# Patient Record
Sex: Female | Born: 1965 | Race: Black or African American | Hispanic: No | Marital: Single
Health system: Southern US, Community
[De-identification: ages and names within clinical notes are randomized; demographics above are authoritative.]

## PROBLEM LIST (undated history)

## (undated) DIAGNOSIS — S12100A Unspecified displaced fracture of second cervical vertebra, initial encounter for closed fracture: Secondary | ICD-10-CM

## (undated) DIAGNOSIS — Z950 Presence of cardiac pacemaker: Secondary | ICD-10-CM

## (undated) DIAGNOSIS — G959 Disease of spinal cord, unspecified: Secondary | ICD-10-CM

## (undated) DIAGNOSIS — M1990 Unspecified osteoarthritis, unspecified site: Secondary | ICD-10-CM

## (undated) DIAGNOSIS — J189 Pneumonia, unspecified organism: Secondary | ICD-10-CM

## (undated) DIAGNOSIS — D509 Iron deficiency anemia, unspecified: Secondary | ICD-10-CM

## (undated) DIAGNOSIS — E119 Type 2 diabetes mellitus without complications: Secondary | ICD-10-CM

## (undated) DIAGNOSIS — G629 Polyneuropathy, unspecified: Secondary | ICD-10-CM

## (undated) DIAGNOSIS — Z8673 Personal history of transient ischemic attack (TIA), and cerebral infarction without residual deficits: Secondary | ICD-10-CM

## (undated) DIAGNOSIS — G5793 Unspecified mononeuropathy of bilateral lower limbs: Secondary | ICD-10-CM

## (undated) DIAGNOSIS — R7989 Other specified abnormal findings of blood chemistry: Secondary | ICD-10-CM

## (undated) DIAGNOSIS — Z8674 Personal history of sudden cardiac arrest: Secondary | ICD-10-CM

## (undated) DIAGNOSIS — G5602 Carpal tunnel syndrome, left upper limb: Secondary | ICD-10-CM

## (undated) DIAGNOSIS — G9519 Other vascular myelopathies: Secondary | ICD-10-CM

## (undated) HISTORY — DX: Polyneuropathy, unspecified: G62.9

## (undated) HISTORY — DX: Type 2 diabetes mellitus without complications: E11.9

## (undated) HISTORY — PX: OTHER SURGICAL HISTORY: SHX169

## (undated) HISTORY — PX: ENDOMETRIAL ABLATION: SHX621

## (undated) HISTORY — DX: Iron deficiency anemia, unspecified: D50.9

## (undated) HISTORY — PX: CERVIX SURGERY: SHX593

## (undated) HISTORY — DX: Disease of spinal cord, unspecified: G95.9

## (undated) HISTORY — DX: Unspecified osteoarthritis, unspecified site: M19.90

---

## 2009-09-17 ENCOUNTER — Ambulatory Visit: Payer: Self-pay | Admitting: Obstetrics & Gynecology

## 2009-09-17 LAB — CONVERTED CEMR LAB
Glucose, Bld: 126 mg/dL — ABNORMAL HIGH (ref 70–99)
MCHC: 27.5 g/dL — ABNORMAL LOW (ref 30.0–36.0)
MCV: 58.1 fL — ABNORMAL LOW (ref 78.0–100.0)
Platelets: 496 10*3/uL — ABNORMAL HIGH (ref 150–400)
RDW: 19.9 % — ABNORMAL HIGH (ref 11.5–15.5)
Total CHOL/HDL Ratio: 2.7
Triglycerides: 67 mg/dL (ref <150)
WBC: 7.3 10*3/uL (ref 4.0–10.5)

## 2009-10-01 ENCOUNTER — Ambulatory Visit (HOSPITAL_COMMUNITY): Admission: RE | Admit: 2009-10-01 | Discharge: 2009-10-01 | Payer: Self-pay | Admitting: Obstetrics & Gynecology

## 2009-10-15 ENCOUNTER — Ambulatory Visit: Payer: Self-pay | Admitting: Obstetrics & Gynecology

## 2009-10-15 LAB — CONVERTED CEMR LAB
Chloride: 108 meq/L (ref 96–112)
Creatinine, Ser: 0.59 mg/dL (ref 0.40–1.20)
Hgb F Quant: 0 % (ref 0.0–2.0)
Hgb S Quant: 33.4 % — ABNORMAL HIGH (ref 0.0–0.0)
RBC Folate: 452 ng/mL (ref 180–600)
Vitamin B-12: 396 pg/mL (ref 211–911)

## 2009-10-21 ENCOUNTER — Ambulatory Visit: Payer: Self-pay | Admitting: Obstetrics & Gynecology

## 2009-11-10 ENCOUNTER — Ambulatory Visit: Payer: Self-pay | Admitting: Obstetrics & Gynecology

## 2010-06-29 NOTE — Assessment & Plan Note (Signed)
NAME:  MALAAK, STACH NO.:  [id number]   MEDICAL RECORD NO.:  [id number]          PATIENT TYPE:  POB   LOCATION:  [facility]         FACILITY:  [facility]   PHYSICIAN:  Allie Bossier, MD        DATE OF BIRTH:  26-Aug-1965   DATE OF SERVICE:  09/17/2009                                  CLINIC NOTE   Ms. Welden is a 45 year old divorced black gravida 1, para 1.  She is a 68-  year-old son.  She comes in here for a new patient annual exam.  She has  not had a Pap smear for at least 10 years.  She has no particular GYN  complaints.   PAST MEDICAL HISTORY:  She has been told she has fibroids, obesity,  history of herpes, osteoarthritis, history of anemia and history of self-  diagnosed depression that has been untreated.   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  She has been abstinent for 2 years.  Previously in a heterosexual  relationship.   PAST SURGICAL HISTORY:  None.   FAMILY HISTORY:  Positive for breast cancer in maternal aunt.  No GYN or  colon cancer.   SOCIAL HISTORY:  Negative for tobacco or drugs.  She drinks alcohol  rarely.   ALLERGIES:  No known drug allergies.  No latex allergies.   PHYSICAL EXAMINATION:  VITAL SIGNS:  Height 5 feet 4 inches, weight 224,  blood pressure 112/76, pulse 75.  HEENT:  Normal.  HEART:  Regular rate and rhythm.  BREASTS:  Normal bilaterally.  ABDOMEN:  Obese, benign.  PELVIC:  External genitalia, No lesions.  Cervix, there is a friable  cervical polyp.  This was partially removed and was unable to remove the  whole thing.  However, the portion I did remove was sent to Pathology.  Bimanual exam reveals an enlarged uterus approximately 8-10 weeks size,  but exam is difficult due to patient's voluntary guarding.  Adnexa are  nontender and there are no appreciable masses.   ASSESSMENT AND PLAN:  1. Annual exam.  I have checked Pap smear and recommended self-breast      and self-vulvar  exams.  I have sent her cervical polyp to the lab      with regard to her uterine fibroids and enlarged uterus.  I am      ordering a GYN ultrasound for health maintenance.  Due to history      of anemia, I am ordering a CBC, a TSH and also checking      her random glucose today.  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  She will follow up in a month.      Allie Bossier, MD     MCD/MEDQ  D:  09/17/2009  T:  09/18/2009  Job:  [id number]

## 2010-06-29 NOTE — Assessment & Plan Note (Signed)
NAME:  Catherine Avery, Catherine Avery NO.:  [id number]   MEDICAL RECORD NO.:  [id number]          PATIENT TYPE:  POB   LOCATION:  [facility]         FACILITY:  [facility]   PHYSICIAN:  Jaynie Collins, MD     DATE OF BIRTH:  04-Sep-1965   DATE OF SERVICE:  10/21/2009                                  CLINIC NOTE   REASON FOR VISIT:  Endometrial biopsy.   HISTORY OF PRESENT ILLNESS:  The patient is a 45 year old gravida 1,  para 1 who was seen for annual examination in September 17, 2009.  At that  visit, the patient was noted to have a friable cervical polyp which was  partly removed and secondary to her history of anemia, uterine fibroids,  an enlarged uterus.  A few studies were ordered including a TIBC, TSH,  and a pelvic ultrasound was also ordered.  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  The patient also had an ultrasound on September 21, 2009,  which was remarkable for a 11-week sized uterus with heterogeneous  myometrium and a trilayer pattern of the endometrium with an anterior-  posterior width of 1.3 cm.  There was also noted to have an area of  focal echogenic thickening, measuring 1.5 x 2 x 1.1.  Appearance did  have flow on Doppler evaluation and suspicious for focal polyp or focal  hyperplasia.  No other abnormalities were seen.  Given this finding, the  patient was scheduled for an endometrial biopsy today.  She does report  that her last period ended a few days ago.  The first day of her last  menstrual period was October 14, 2009, and a urine pregnancy test that  was done today was negative.   ENDOMETRIAL BIOPSY:  The patient was counseled regarding the need for  the endometrial biopsy.  The routine risks were discussed and all her  questions answered.  Written informed consent was obtained.  The patient  was then placed in a  dorsal lithotomy position.  A vaginal speculum was  placed and cervix was swabbed with Betadine x2.  A tenaculum was placed  in the anterior lip of the cervix and then a 3-mm Pipelle was advanced  into the uterine cavity to a depth of 10 cm.  The suction on the Pipelle  was activated and the Pipelle was slowly rotated to obtain some tissue.  The patient did have minimal bleeding at the end of the procedure and  then all instruments were removed from the patient's pelvis.  Routine  post endometrial biopsy instructions were given to the patient.   IMPRESSION:  The patient is a 45 year old gravida 1, para 1 who is  status post endometrial biopsy for a known history of anemia and  menorrhagia.  The patient also has iron deficiency anemia.  She was  given a prescription of ferrous sulfate and Colace, given her iron  deficiency anemia and informed of her hemoglobin AS status.  As for her  abnormal ultrasound findings, we will follow up the  endometrial biopsy  results and the patient was told that when she comes back to discuss the  results in a couple of weeks further management of her menorrhagia will  be discussed at that point.  The patient verbalized understanding of  plan.           ______________________________  Jaynie Collins, MD     UA/MEDQ  D:  10/21/2009  T:  10/21/2009  Job:  [id number]

## 2010-06-29 NOTE — Assessment & Plan Note (Signed)
NAME:  Catherine Avery, NEISES NO.:  [id number]   MEDICAL RECORD NO.:  [id number]          PATIENT TYPE:  POB   LOCATION:  [facility]         FACILITY:  [facility]   PHYSICIAN:  Elsie Lincoln, MD      DATE OF BIRTH:  Jun 25, 1965   DATE OF SERVICE:  10/15/2009                                  CLINIC NOTE   The patient is a 45 year old female who presents for lab results.  Per  patient, just for review, has very heavy periods, but they do only come  once a week or once a month.  She believes she has fibroids.  However,  her transvaginal ultrasound reveals that she does not have fibroids.  It  does reveal that she possibly has a focal polyp or thick endometrium at  the fundus.  I believe the endometrial biopsy is warranted and that she  will be rescheduled for that next week as she does not like it today.  Other results, she is anemic at a hemoglobin of 7.0, it is microcytic.  Her platelets are elevated at 796, but this can be seen with anemia.  We  are going to need a workup, but most likely it will be iron deficiency  and we will start her on iron when those labs return.  She does not know  if she is sickle cell trait, but she does not believe she has.  Her  cholesterol is completely normal which is good news.  Her TSH is normal.  Her glucose is 126, but she was not fasting, although it was easy to  believe that she was.  She had eaten an oatmeal from [employer], so we are  drawing another glucose today.  Her Pap smear was normal.  We have done  an endocervical polyp removal which came back with no malignant features  and her mammogram was BI-RADS I.  We had a lengthy discussion about all  these results.  She understands she needs an endometrial biopsy.  This  has been scheduled next week.  I believe that eventually she will need D  and C, hysteroscopy, and hydrothermal ablation as I believe this is the  best method to get a polyp other than a ThermaChoice balloon or  NovaSure.  We are also getting records from [place] because she is  under the impression that she did have fibroids, although our ultrasound  did not show fibroids.  She would just like [place] to review her old records  and say why is that she was told she had fibroids.  The patient to come  back in a week           ______________________________  Elsie Lincoln, MD     KL/MEDQ  D:  10/15/2009  T:  10/16/2009  Job:  [id number]

## 2011-01-21 ENCOUNTER — Inpatient Hospital Stay: Payer: Self-pay | Admitting: Internal Medicine

## 2011-02-21 ENCOUNTER — Ambulatory Visit: Payer: Self-pay | Admitting: Internal Medicine

## 2011-02-21 LAB — CBC CANCER CENTER
Basophil %: 0 %
Eosinophil %: 2.6 %
HCT: 31.5 % — ABNORMAL LOW (ref 35.0–47.0)
Lymphocyte #: 2 x10 3/mm (ref 1.0–3.6)
MCH: 22.8 pg — ABNORMAL LOW (ref 26.0–34.0)
MCHC: 32.6 g/dL (ref 32.0–36.0)
MCV: 70 fL — ABNORMAL LOW (ref 80–100)
Monocyte #: 0.6 x10 3/mm (ref 0.0–0.7)

## 2011-02-21 LAB — IRON AND TIBC
Iron Bind.Cap.(Total): 322 ug/dL (ref 250–450)
Unbound Iron-Bind.Cap.: 293 ug/dL

## 2011-02-21 LAB — FERRITIN: Ferritin (ARMC): 20 ng/mL (ref 8–388)

## 2011-03-18 ENCOUNTER — Ambulatory Visit: Payer: Self-pay | Admitting: Internal Medicine

## 2011-03-21 LAB — CBC CANCER CENTER
Basophil #: 0 x10 3/mm (ref 0.0–0.1)
Basophil %: 0.2 %
HGB: 10.4 g/dL — ABNORMAL LOW (ref 12.0–16.0)
Lymphocyte %: 18 %
Monocyte %: 9.3 %
Neutrophil #: 7.2 x10 3/mm — ABNORMAL HIGH (ref 1.4–6.5)
RBC: 4.31 10*6/uL (ref 3.80–5.20)

## 2011-04-11 LAB — IRON AND TIBC
Iron Bind.Cap.(Total): 309 ug/dL (ref 250–450)
Iron: 52 ug/dL (ref 50–170)
Unbound Iron-Bind.Cap.: 257 ug/dL

## 2011-04-11 LAB — CANCER CENTER HEMOGLOBIN: HGB: 11.7 g/dL — ABNORMAL LOW (ref 12.0–16.0)

## 2011-04-15 ENCOUNTER — Ambulatory Visit: Payer: Self-pay | Admitting: Internal Medicine

## 2011-05-16 ENCOUNTER — Ambulatory Visit: Payer: Self-pay | Admitting: Internal Medicine

## 2011-05-26 LAB — CANCER CENTER HEMOGLOBIN: HGB: 11.2 g/dL — ABNORMAL LOW (ref 12.0–16.0)

## 2011-06-15 ENCOUNTER — Ambulatory Visit: Payer: Self-pay | Admitting: Internal Medicine

## 2011-07-05 ENCOUNTER — Ambulatory Visit: Payer: Self-pay | Admitting: Internal Medicine

## 2011-07-16 ENCOUNTER — Ambulatory Visit: Payer: Self-pay | Admitting: Internal Medicine

## 2011-07-27 LAB — CANCER CENTER HEMOGLOBIN: HGB: 11.8 g/dL — ABNORMAL LOW (ref 12.0–16.0)

## 2011-07-27 LAB — IRON AND TIBC: Unbound Iron-Bind.Cap.: 254 ug/dL

## 2011-08-15 ENCOUNTER — Ambulatory Visit: Payer: Self-pay | Admitting: Internal Medicine

## 2011-08-24 LAB — CBC CANCER CENTER
HCT: 33.3 % — ABNORMAL LOW (ref 35.0–47.0)
Lymphocyte #: 1.8 x10 3/mm (ref 1.0–3.6)
Lymphocyte %: 24.6 %
MCHC: 32.2 g/dL (ref 32.0–36.0)
MCV: 81 fL (ref 80–100)
Monocyte #: 0.6 x10 3/mm (ref 0.2–0.9)
Neutrophil #: 4.7 x10 3/mm (ref 1.4–6.5)
RDW: 17.3 % — ABNORMAL HIGH (ref 11.5–14.5)

## 2011-09-15 ENCOUNTER — Ambulatory Visit: Payer: Self-pay | Admitting: Internal Medicine

## 2011-10-19 ENCOUNTER — Ambulatory Visit: Payer: Self-pay | Admitting: Internal Medicine

## 2011-10-19 LAB — CANCER CENTER HEMOGLOBIN: HGB: 7.8 g/dL — ABNORMAL LOW (ref 12.0–16.0)

## 2011-10-19 LAB — IRON AND TIBC: Iron Bind.Cap.(Total): 256 ug/dL (ref 250–450)

## 2011-10-20 LAB — PREGNANCY, URINE: Pregnancy Test, Urine: NEGATIVE m[IU]/mL

## 2011-11-15 ENCOUNTER — Ambulatory Visit: Payer: Self-pay | Admitting: Internal Medicine

## 2011-11-30 LAB — IRON AND TIBC
Iron Saturation: 11 %
Iron: 33 ug/dL — ABNORMAL LOW (ref 50–170)
Unbound Iron-Bind.Cap.: 272 ug/dL

## 2011-12-16 ENCOUNTER — Ambulatory Visit: Payer: Self-pay | Admitting: Internal Medicine

## 2011-12-28 LAB — CANCER CENTER HEMOGLOBIN: HGB: 11 g/dL — ABNORMAL LOW (ref 12.0–16.0)

## 2011-12-28 LAB — FERRITIN: Ferritin (ARMC): 51 ng/mL (ref 8–388)

## 2012-01-15 ENCOUNTER — Ambulatory Visit: Payer: Self-pay | Admitting: Internal Medicine

## 2012-01-18 LAB — CBC CANCER CENTER
Basophil #: 0.1 x10 3/mm (ref 0.0–0.1)
Eosinophil #: 0.3 x10 3/mm (ref 0.0–0.7)
HCT: 35.6 % (ref 35.0–47.0)
Lymphocyte #: 1.6 x10 3/mm (ref 1.0–3.6)
MCH: 25.2 pg — ABNORMAL LOW (ref 26.0–34.0)
MCHC: 32.9 g/dL (ref 32.0–36.0)
MCV: 77 fL — ABNORMAL LOW (ref 80–100)
Monocyte #: 0.5 x10 3/mm (ref 0.2–0.9)
Monocyte %: 7.2 %
Neutrophil #: 4 x10 3/mm (ref 1.4–6.5)
Platelet: 301 x10 3/mm (ref 150–440)
RDW: 14.7 % — ABNORMAL HIGH (ref 11.5–14.5)
WBC: 6.5 x10 3/mm (ref 3.6–11.0)

## 2012-02-06 LAB — FERRITIN: Ferritin (ARMC): 36 ng/mL (ref 8–388)

## 2012-02-15 ENCOUNTER — Ambulatory Visit: Payer: Self-pay | Admitting: Internal Medicine

## 2012-02-27 LAB — CANCER CENTER HEMOGLOBIN: HGB: 12.3 g/dL (ref 12.0–16.0)

## 2012-03-17 ENCOUNTER — Ambulatory Visit: Payer: Self-pay | Admitting: Internal Medicine

## 2012-04-09 LAB — CANCER CENTER HEMOGLOBIN: HGB: 13 g/dL (ref 12.0–16.0)

## 2012-04-09 LAB — FERRITIN: Ferritin (ARMC): 52 ng/mL (ref 8–388)

## 2012-04-14 ENCOUNTER — Ambulatory Visit: Payer: Self-pay | Admitting: Internal Medicine

## 2012-05-18 ENCOUNTER — Ambulatory Visit: Payer: Self-pay | Admitting: Internal Medicine

## 2012-05-21 LAB — CANCER CENTER HEMOGLOBIN: HGB: 13.5 g/dL (ref 12.0–16.0)

## 2012-06-14 ENCOUNTER — Ambulatory Visit: Payer: Self-pay | Admitting: Internal Medicine

## 2012-10-10 ENCOUNTER — Ambulatory Visit: Payer: Self-pay | Admitting: Internal Medicine

## 2013-01-09 IMAGING — MG MM CAD SCREENING MAMMO
1 series · 7 of 7 positions shown · non-contrast
Comparison: none

REASON FOR EXAM: SCR MAMMO NO ORDER
COMMENTS:

[R CC · right · 7 of 7 slices shown]
[im 1/7]
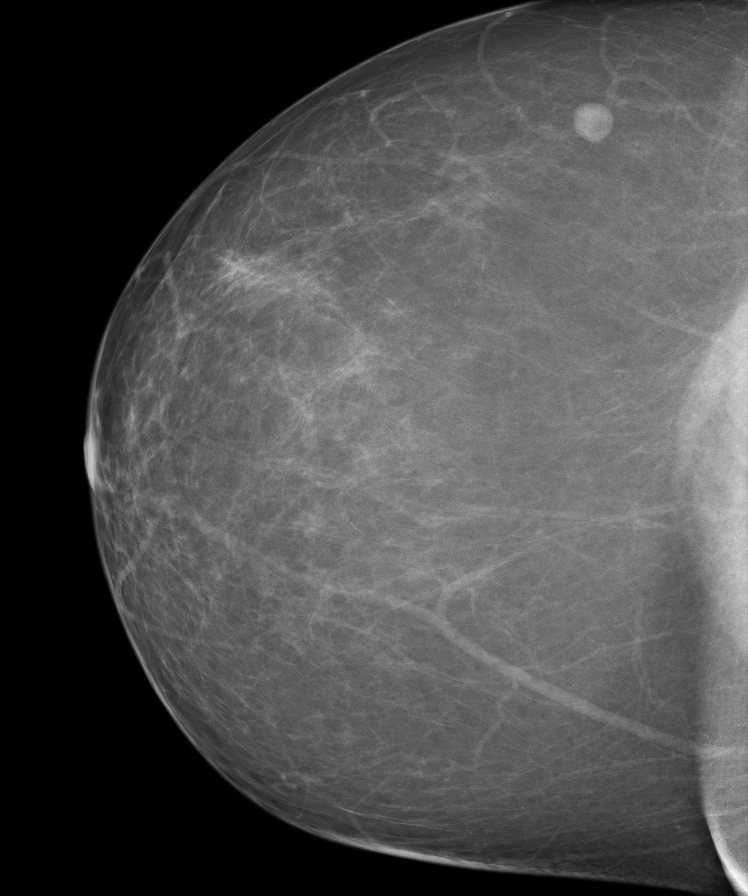
[im 2/7]
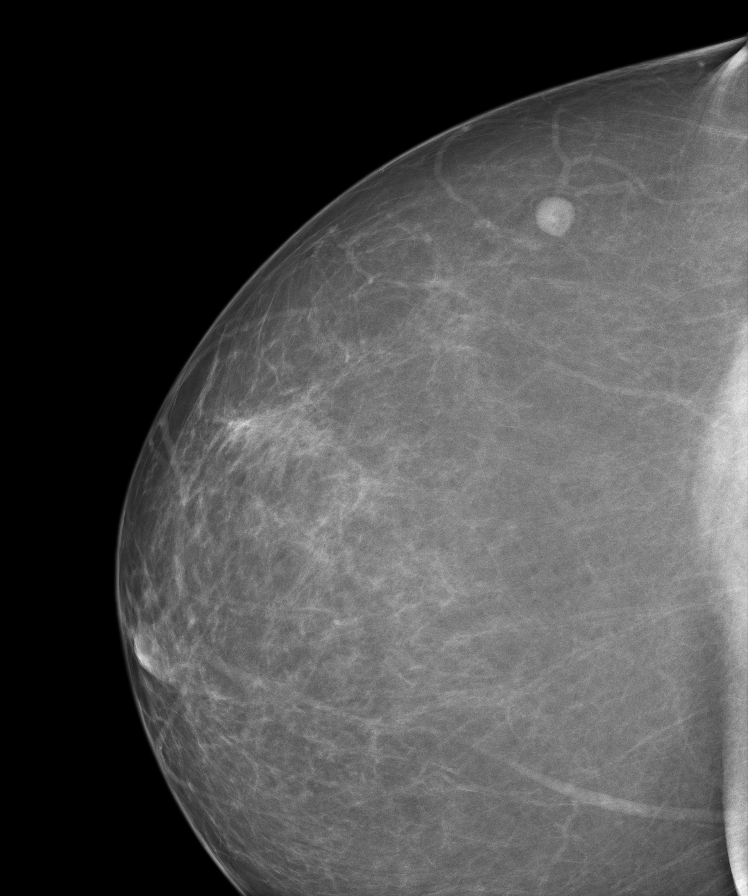
[im 3/7]
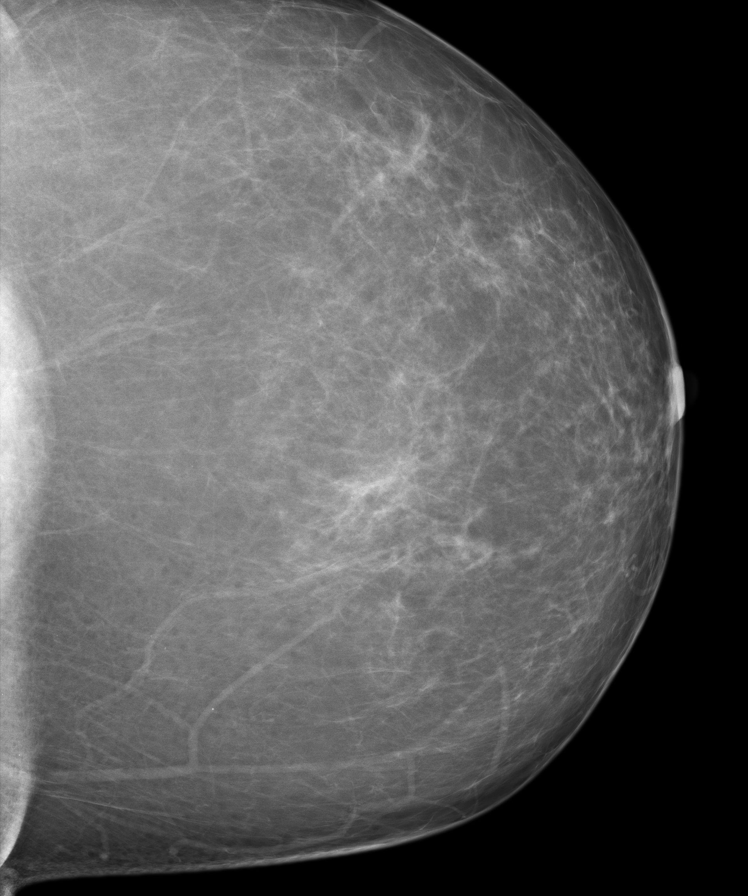
[im 4/7]
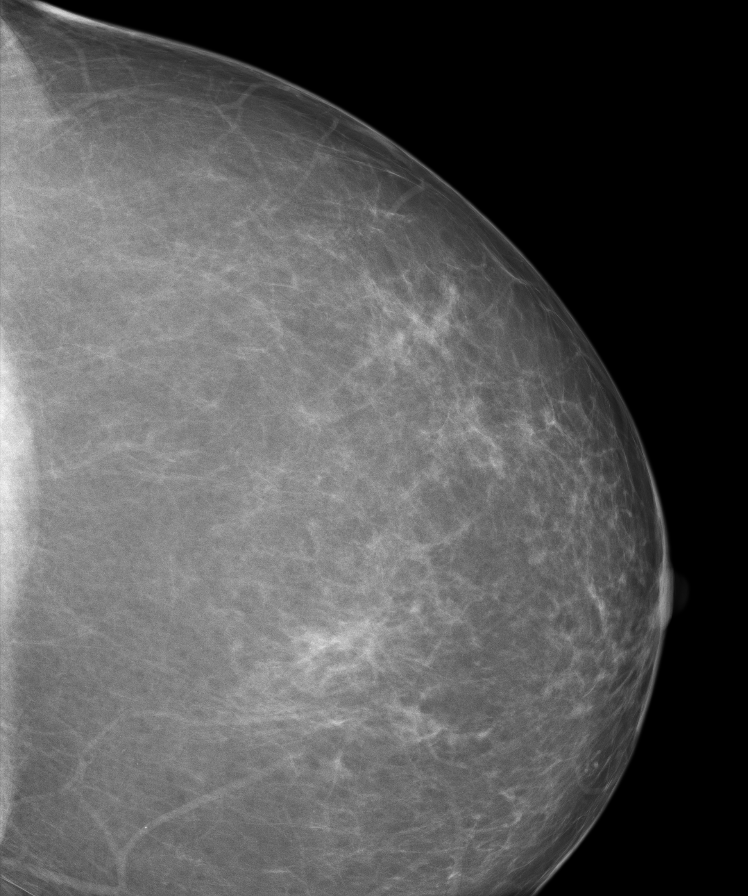
[im 5/7]
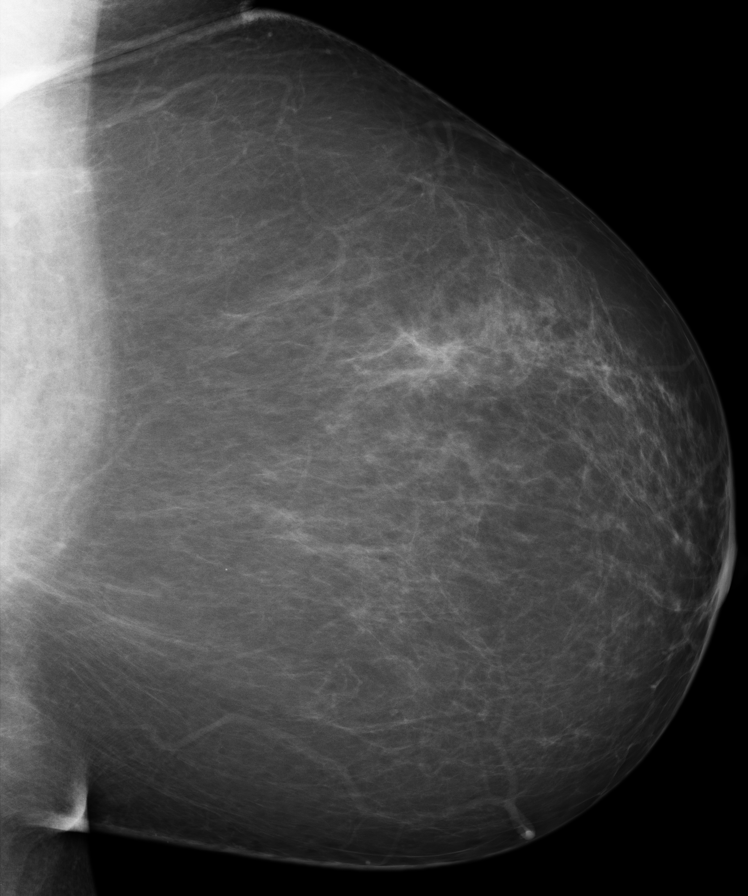
[im 6/7]
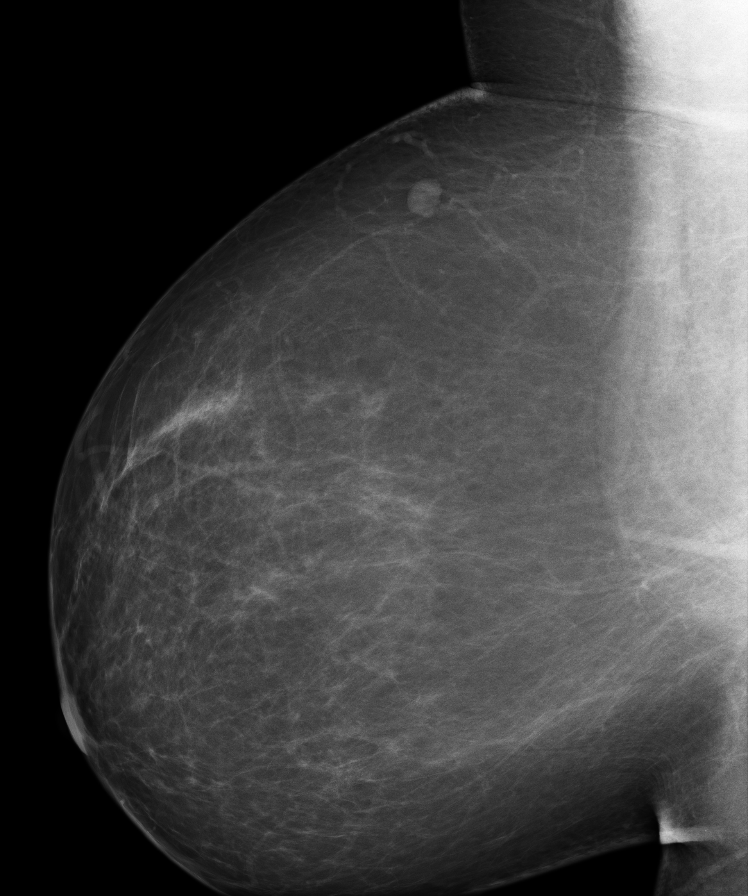
[im 7/7]
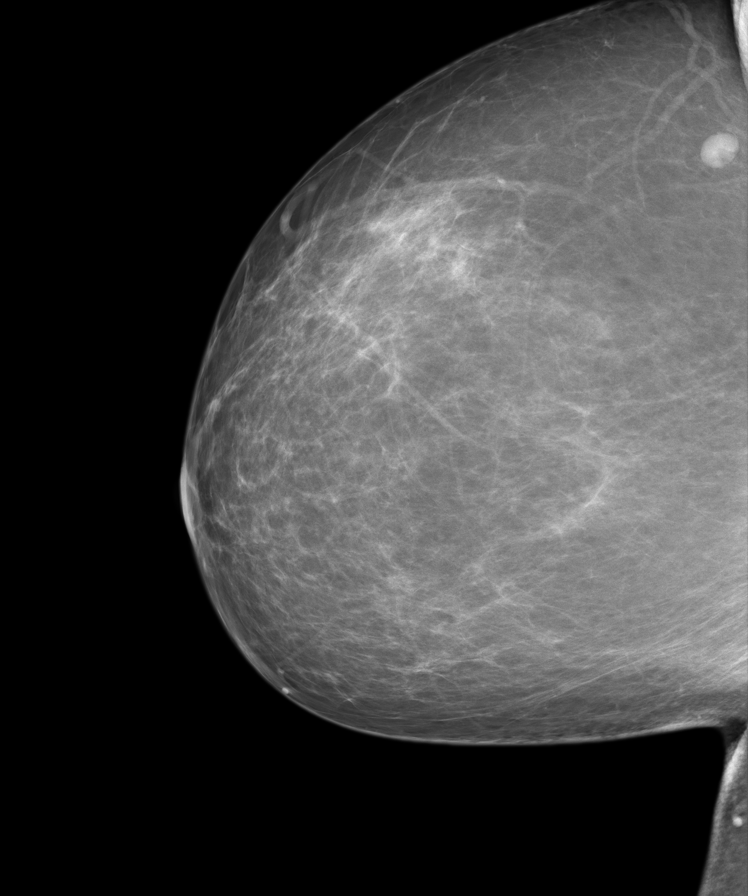

[7 of 7 positions shown; findings below may reference images not displayed]

PROCEDURE:     MAM - MAM DGTL SCRN MAM NO ORDER W/CAD  - July 05, 2011 [DATE]

RESULT:     Comparison is made to prior study dated 10-01-2009.

The breasts demonstrate a mild heterogeneous parenchymal pattern. Stable,
smoothly marginated nodular density projects in the superolateral portion of
the right breast. This demonstrates a benign appearance. There is no
radiographic evidence to suggest malignancy.
IMPRESSION: BI-RADS: Category 2 - Benign Findings

A NEGATIVE MAMMOGRAM REPORT DOES NOT PRECLUDE BIOPSY OR OTHER EVALUATION OF
A CLINICALLY PALPABLE OR OTHERWISE SUSPICIOUS MASS OR LESION. BREAST CANCER
MAY NOT BE DETECTED BY MAMMOGRAPHY IN UP TO 10% OF CASES.

## 2013-03-25 ENCOUNTER — Ambulatory Visit: Payer: Self-pay | Admitting: Neurology

## 2013-12-06 ENCOUNTER — Ambulatory Visit: Payer: Self-pay | Admitting: Internal Medicine

## 2014-05-01 ENCOUNTER — Ambulatory Visit: Payer: Self-pay | Admitting: Internal Medicine

## 2015-02-04 ENCOUNTER — Ambulatory Visit
Admission: RE | Admit: 2015-02-04 | Discharge: 2015-02-04 | Disposition: A | Discharge status: Home / Self Care | Payer: Disability Insurance | Source: Ambulatory Visit | Attending: Dentistry | Admitting: Dentistry

## 2015-02-04 ENCOUNTER — Other Ambulatory Visit: Payer: Self-pay | Admitting: Dentistry

## 2015-02-04 DIAGNOSIS — Z981 Arthrodesis status: Secondary | ICD-10-CM | POA: Diagnosis not present

## 2015-02-04 DIAGNOSIS — M5090 Cervical disc disorder, unspecified, unspecified cervical region: Secondary | ICD-10-CM | POA: Insufficient documentation

## 2015-02-04 DIAGNOSIS — M47814 Spondylosis without myelopathy or radiculopathy, thoracic region: Secondary | ICD-10-CM | POA: Diagnosis not present

## 2015-06-23 ENCOUNTER — Encounter: Payer: Self-pay | Admitting: Emergency Medicine

## 2015-06-23 ENCOUNTER — Emergency Department
Admission: EM | Admit: 2015-06-23 | Discharge: 2015-06-24 | Disposition: A | Discharge status: Home / Self Care | Payer: BLUE CROSS/BLUE SHIELD | Attending: Emergency Medicine | Admitting: Emergency Medicine

## 2015-06-23 DIAGNOSIS — Y939 Activity, unspecified: Secondary | ICD-10-CM | POA: Insufficient documentation

## 2015-06-23 DIAGNOSIS — Y929 Unspecified place or not applicable: Secondary | ICD-10-CM | POA: Insufficient documentation

## 2015-06-23 DIAGNOSIS — W57XXXA Bitten or stung by nonvenomous insect and other nonvenomous arthropods, initial encounter: Secondary | ICD-10-CM | POA: Insufficient documentation

## 2015-06-23 DIAGNOSIS — S30860A Insect bite (nonvenomous) of lower back and pelvis, initial encounter: Secondary | ICD-10-CM | POA: Diagnosis present

## 2015-06-23 DIAGNOSIS — Y999 Unspecified external cause status: Secondary | ICD-10-CM | POA: Insufficient documentation

## 2015-06-23 NOTE — ED Notes (Signed)
Patient ambulatory to triage with steady gait, without difficulty or distress noted; pt reports embedded tick into lower back

## 2015-06-24 MED ORDER — DOXYCYCLINE HYCLATE 100 MG PO TABS: 100.0000 mg | ORAL_TABLET | Freq: Two times a day (BID) | ORAL | Status: DC

## 2015-06-24 MED ORDER — DOXYCYCLINE HYCLATE 100 MG PO TABS
100.0000 mg | ORAL_TABLET | Freq: Once | ORAL | Status: AC
  Administered 2015-06-24: 100 mg via ORAL
  Filled 2015-06-24: qty 1

## 2015-06-24 NOTE — ED Provider Notes (Signed)
[facility] Emergency Department Provider Note  ____________________________________________  Time seen: 12:10 AM  I have reviewed the triage vital signs and the nursing notes.   HISTORY  Chief Complaint Insect Bite      HPI Catherine Avery is a 50 y.o. female presents with "tick embedded in her lower back". Patient states that she's had chills and generalized muscle aches 4 days. Patient states that she's had difficulty keeping warm and has been sitting in front of a heater as a result. Patient denies any rash no fever. Afebrile on presentation with a temperature 98.8    Past medical history None There are no active problems to display for this patient.   Past Surgical History  Procedure Laterality Date  . Cervix surgery      No current outpatient prescriptions on file.  Allergies No known drug allergies No family history on file.  Social History Social History  Substance Use Topics  . Smoking status: None  . Smokeless tobacco: None  . Alcohol Use: None    Review of Systems  Constitutional: Negative for fever. Eyes: Negative for visual changes. ENT: Negative for sore throat. Cardiovascular: Negative for chest pain. Respiratory: Negative for shortness of breath. Gastrointestinal: Negative for abdominal pain, vomiting and diarrhea. Genitourinary: Negative for dysuria. Musculoskeletal: Negative for back pain. Skin: Negative for rash.Positive for tick bite Neurological: Negative for headaches, focal weakness or numbness.   10-point ROS otherwise negative.  ____________________________________________   PHYSICAL EXAM:  VITAL SIGNS: ED Triage Vitals  Enc Vitals Group     BP 06/23/15 2312 103/63 mmHg     Pulse Rate 06/23/15 2312 61     Resp 06/23/15 2312 18     Temp 06/23/15 2312 98.8 F (37.1 C)     Temp Source 06/23/15 2312 Oral     SpO2 06/23/15 2312 100 %     Weight --      Height --      Head Cir --      Peak Flow  --      Pain Score 06/24/15 0013 4     Pain Loc --      Pain Edu? --      Excl. in GC? --      Constitutional: Alert and oriented. Well appearing and in no distress. Eyes: Conjunctivae are normal. PERRL. Normal extraocular movements. ENT   Head: Normocephalic and atraumatic.   Nose: No congestion/rhinnorhea.   Mouth/Throat: Mucous membranes are moist.   Neck: No stridor. Musculoskeletal: Nontender with normal range of motion in all extremities. No joint effusions.  No lower extremity tenderness nor edema. Neurologic:  Normal speech and language. No gross focal neurologic deficits are appreciated. Speech is normal.  Skin:  Skin is warm, dry and intact. No rash noted. Psychiatric: Mood and affect are normal. Speech and behavior are normal. Patient exhibits appropriate insight and judgment.     INITIAL IMPRESSION / ASSESSMENT AND PLAN / ED COURSE  Pertinent labs & imaging results that were available during my care of the patient were reviewed by me and considered in my medical decision making (see chart for details).  Embedded tick removed from the patient's left lower back via tweezers  ____________________________________________   FINAL CLINICAL IMPRESSION(S) / ED DIAGNOSES  Final diagnoses:  Tick bite of back, initial encounter      Darci Currentandolph N Brown, MD 06/24/15 [id number]

## 2015-06-24 NOTE — Discharge Instructions (Signed)
Tick Bite Information Ticks are insects that attach themselves to the skin and draw blood for food. There are various types of ticks. Common types include wood ticks and deer ticks. Most ticks live in shrubs and grassy areas. Ticks can climb onto your body when you make contact with leaves or grass where the tick is waiting. The most common places on the body for ticks to attach themselves are the scalp, neck, armpits, waist, and groin. Most tick bites are harmless, but sometimes ticks carry germs that cause diseases. These germs can be spread to a person during the tick's feeding process. The chance of a disease spreading through a tick bite depends on:   The type of tick.  Time of year.   How long the tick is attached.   Geographic location.  HOW CAN YOU PREVENT TICK BITES? Take these steps to help prevent tick bites when you are outdoors:  Wear protective clothing. Long sleeves and long pants are best.   Wear white clothes so you can see ticks more easily.  Tuck your pant legs into your socks.   If walking on a trail, stay in the middle of the trail to avoid brushing against bushes.  Avoid walking through areas with long grass.  Put insect repellent on all exposed skin and along boot tops, pant legs, and sleeve cuffs.   Check clothing, hair, and skin repeatedly and before going inside.   Brush off any ticks that are not attached.  Take a shower or bath as soon as possible after being outdoors.  WHAT IS THE PROPER WAY TO REMOVE A TICK? Ticks should be removed as soon as possible to help prevent diseases caused by tick bites. 1. If latex gloves are available, put them on before trying to remove a tick.  2. Using fine-point tweezers, grasp the tick as close to the skin as possible. You may also use curved forceps or a tick removal tool. Grasp the tick as close to its head as possible. Avoid grasping the tick on its body. 3. Pull gently with steady upward pressure until  the tick lets go. Do not twist the tick or jerk it suddenly. This may break off the tick's head or mouth parts. 4. Do not squeeze or crush the tick's body. This could force disease-carrying fluids from the tick into your body.  5. After the tick is removed, wash the bite area and your hands with soap and water or other disinfectant such as alcohol. 6. Apply a small amount of antiseptic cream or ointment to the bite site.  7. Wash and disinfect any instruments that were used.  Do not try to remove a tick by applying a hot match, petroleum jelly, or fingernail polish to the tick. These methods do not work and may increase the chances of disease being spread from the tick bite.  WHEN SHOULD YOU SEEK MEDICAL CARE? Contact your health care provider if you are unable to remove a tick from your skin or if a part of the tick breaks off and is stuck in the skin.  After a tick bite, you need to be aware of signs and symptoms that could be related to diseases spread by ticks. Contact your health care provider if you develop any of the following in the days or weeks after the tick bite:  Unexplained fever.  Rash. A circular rash that appears days or weeks after the tick bite may indicate the possibility of Lyme disease. The rash may resemble   a target with a bull's-eye and may occur at a different part of your body than the tick bite.  Redness and swelling in the area of the tick bite.   Tender, swollen lymph glands.   Diarrhea.   Weight loss.   Cough.   Fatigue.   Muscle, joint, or bone pain.   Abdominal pain.   Headache.   Lethargy or a change in your level of consciousness.  Difficulty walking or moving your legs.   Numbness in the legs.   Paralysis.  Shortness of breath.   Confusion.   Repeated vomiting.    This information is not intended to replace advice given to you by your health care provider. Make sure you discuss any questions you have with your health  care provider.   Document Released: 01/29/2000 Document Revised: 02/21/2014 Document Reviewed: 07/11/2012 Elsevier Interactive Patient Education 2016 [employer]  

## 2015-10-30 ENCOUNTER — Other Ambulatory Visit: Payer: Self-pay | Admitting: Internal Medicine

## 2015-10-30 DIAGNOSIS — Z1231 Encounter for screening mammogram for malignant neoplasm of breast: Secondary | ICD-10-CM

## 2015-11-23 ENCOUNTER — Ambulatory Visit
Admission: RE | Admit: 2015-11-23 | Discharge: 2015-11-23 | Disposition: A | Discharge status: Home / Self Care | Payer: BLUE CROSS/BLUE SHIELD | Source: Ambulatory Visit | Attending: Internal Medicine | Admitting: Internal Medicine

## 2015-11-23 DIAGNOSIS — Z1231 Encounter for screening mammogram for malignant neoplasm of breast: Secondary | ICD-10-CM | POA: Insufficient documentation

## 2016-02-10 ENCOUNTER — Other Ambulatory Visit: Payer: Self-pay | Admitting: Internal Medicine

## 2016-02-10 ENCOUNTER — Ambulatory Visit
Admission: RE | Admit: 2016-02-10 | Discharge: 2016-02-10 | Disposition: A | Discharge status: Home / Self Care | Payer: BLUE CROSS/BLUE SHIELD | Source: Ambulatory Visit | Attending: Internal Medicine | Admitting: Internal Medicine

## 2016-02-10 DIAGNOSIS — L950 Livedoid vasculitis: Secondary | ICD-10-CM

## 2016-02-10 DIAGNOSIS — R231 Pallor: Principal | ICD-10-CM

## 2016-02-10 DIAGNOSIS — M7989 Other specified soft tissue disorders: Secondary | ICD-10-CM | POA: Diagnosis present

## 2016-11-15 ENCOUNTER — Other Ambulatory Visit: Payer: Self-pay | Admitting: Internal Medicine

## 2016-11-15 DIAGNOSIS — Z1231 Encounter for screening mammogram for malignant neoplasm of breast: Secondary | ICD-10-CM

## 2016-12-14 ENCOUNTER — Ambulatory Visit
Admission: RE | Admit: 2016-12-14 | Discharge: 2016-12-14 | Disposition: A | Discharge status: Home / Self Care | Payer: BLUE CROSS/BLUE SHIELD | Source: Ambulatory Visit | Attending: Internal Medicine | Admitting: Internal Medicine

## 2016-12-14 DIAGNOSIS — Z1231 Encounter for screening mammogram for malignant neoplasm of breast: Secondary | ICD-10-CM | POA: Insufficient documentation

## 2017-06-23 ENCOUNTER — Other Ambulatory Visit: Payer: Self-pay | Admitting: Nurse Practitioner

## 2017-06-23 DIAGNOSIS — I639 Cerebral infarction, unspecified: Secondary | ICD-10-CM

## 2017-07-01 ENCOUNTER — Ambulatory Visit
Admission: RE | Admit: 2017-07-01 | Discharge: 2017-07-01 | Disposition: A | Discharge status: Home / Self Care | Payer: Medicare Other | Source: Ambulatory Visit | Attending: Nurse Practitioner | Admitting: Nurse Practitioner

## 2017-07-01 DIAGNOSIS — I639 Cerebral infarction, unspecified: Secondary | ICD-10-CM | POA: Diagnosis present

## 2018-03-09 ENCOUNTER — Other Ambulatory Visit: Payer: Self-pay | Admitting: Internal Medicine

## 2018-03-09 DIAGNOSIS — Z1231 Encounter for screening mammogram for malignant neoplasm of breast: Secondary | ICD-10-CM

## 2018-03-30 ENCOUNTER — Ambulatory Visit
Admission: RE | Admit: 2018-03-30 | Discharge: 2018-03-30 | Disposition: A | Discharge status: Home / Self Care | Payer: Medicare Other | Source: Ambulatory Visit | Attending: Internal Medicine | Admitting: Internal Medicine

## 2018-03-30 DIAGNOSIS — Z1231 Encounter for screening mammogram for malignant neoplasm of breast: Secondary | ICD-10-CM | POA: Diagnosis present

## 2018-04-03 ENCOUNTER — Ambulatory Visit: Payer: Medicare Other | Attending: Neurology | Admitting: Physical Therapy

## 2018-04-03 DIAGNOSIS — M2560 Stiffness of unspecified joint, not elsewhere classified: Secondary | ICD-10-CM | POA: Diagnosis present

## 2018-04-03 DIAGNOSIS — G459 Transient cerebral ischemic attack, unspecified: Secondary | ICD-10-CM | POA: Insufficient documentation

## 2018-04-03 DIAGNOSIS — Z9181 History of falling: Secondary | ICD-10-CM | POA: Insufficient documentation

## 2018-04-03 DIAGNOSIS — R269 Unspecified abnormalities of gait and mobility: Secondary | ICD-10-CM | POA: Diagnosis present

## 2018-04-03 DIAGNOSIS — M6281 Muscle weakness (generalized): Secondary | ICD-10-CM | POA: Diagnosis present

## 2018-04-03 NOTE — Patient Instructions (Signed)
Access Code: [email or web address]  URL: [email or web address]  Date: 04/03/2018  Prepared by: Dorene Grebe   Exercises  Seated Shoulder Flexion Full Range Single Arm - 10 reps - 2 sets - 1x daily - 7x weekly  Seated Scapular Retraction - 10 reps - 2 sets - 1x daily - 7x weekly  Seated Long Arc Quad - 10 reps - 2 sets - 1x daily - 7x weekly

## 2018-04-04 ENCOUNTER — Encounter: Payer: Self-pay | Admitting: Physical Therapy

## 2018-04-04 NOTE — Therapy (Signed)
[facility] [address] [place], [place], [address] Phone: [phone]   Fax:  [phone]  Physical Therapy Treatment  Patient Details  Name: Catherine Avery MRN: [id number] Date of Birth: 01-12-1966 Referring Provider (PT): Dr. Cristopher Peru   Encounter Date: 04/03/2018  PT End of Session - 04/04/18 1031    Visit Number  1    Number of Visits  9    Date for PT Re-Evaluation  05/01/18    PT Start Time  1602    PT Stop Time  1719    PT Time Calculation (min)  77 min    Equipment Utilized During Treatment  Gait belt    Activity Tolerance  Patient tolerated treatment well;Patient limited by fatigue    Behavior During Therapy  [facility] for tasks assessed/performed       History reviewed. No pertinent past medical history.  Past Surgical History:  Procedure Laterality Date  . CERVIX SURGERY      There were no vitals filed for this visit.  Subjective Assessment - 04/04/18 0853    Subjective  Pt. states since her TIA in April 2019 her left UE and LE have been weak and "heavy." Pt. reports occasional episodes since then in which she "feels like something is coming on and my face will burn and tighten up and something will move down my left arm." Pt. reports that she "relaxes and breathes" in order for this feeling to pass. Pt. states she moves slowly and cautiously in order to prevent falls and "listens to her body to when it feels weak." Pt. reports her L arm is constantly "lit up 24/7" and feels sharp and tingly. Pt. also reports that her L LE feels "heavy and like weights are pulling it down." Pt. states she has difficulties with gripping items and reports sensation changes in L hand. Pt. ambulates with SPC but states in her home she walks around using furniture for balance. Pt. has difficulty falling alseep 2/2 L arm discomfort and neck/shld pain but once she falls asleep, she can stay sleeping. Pt. also states she is cold all of the time and  uses electric blanket. Pt. also reports B knee pain and L sided LBP especially when getting in/ out of the car. Pt reports exercising at [employer] and is "very cautious when getting on and off the treadmill."    Pertinent History  Pt. is a pleasant 53 y/o female who had a TIA in April 2019 in which she fell and could not get up. Pt. reports that her speech was not normal and her face and left side of her body were "not right." Pt. states her first fall was in 2016 after cervical surgery but since her TIA, she has had incr. falls. Pt. states 3-4 falls in the past 6 months with her most recent one being around Thanksgiving 2019. Pt. is currently not working and is on disability (last job was in 2015). Pt. lives with her son (91 y/o) and is available to care for her along with her sister who lives close by. Pt. denies HA/ dizziness but states she has a hard time with bowel movements and urination. Pt. reports that "once she goes, she feels better but is not as regular than before the TIA." PMH includes cervical myelopathy and diabetes but is not managed through medication and pt. denies neuropathy. Pt. is not currently taking any medications except for vitamins.      Limitations  Sitting;Lifting;Standing;Walking;House hold activities    How long can you sit comfortably?  30 min    How long can you stand comfortably?  no limit    How long can you walk comfortably?  has not tried to walk for prolonged period of time    Patient Stated Goals  Improve L UE/LE muscle strength and independence with gait.      Currently in Pain?  Yes    Pain Score  --   pt. could not give number because she states it constantly changes and depends on activity.   Pain Location  Arm    Pain Orientation  Left    Pain Descriptors / Indicators  Tingling;Sharp;Pins and needles    Pain Onset  More than a month ago    Pain Frequency  Constant    Aggravating Factors   incr. in activity    Effect of Pain on Daily Activities  pt. has  difficulties with ADLs 2/2 to L sided weakness and sensation changes in L hand         [facility] - 04/04/18 0001      Assessment   Medical Diagnosis  Weakness L leg    Referring Provider (PT)  Dr. Cristopher PeruHemang Shah    Onset Date/Surgical Date  05/15/17    Hand Dominance  Right    Prior Therapy  no      Precautions   Precautions  None      Balance Screen   Has the patient fallen in the past 6 months  Yes    How many times?  3-4    Has the patient had a decrease in activity level because of a fear of falling?   Yes    Is the patient reluctant to leave their home because of a fear of falling?   No      Home Environment   Living Environment  Private residence    Living Arrangements  Children    Type of Home  House    Home Layout  One level      Prior Function   Level of Independence  Independent          Evaluation:   SENSATION:  Grossly intact sensation with UQN except C6-C8 (pt. Reports incr. Sensitivity C6-C8 dermatomes) Grossly intact sensation with LQN screen/dermatomal testing.     MUSCULOSKELETAL: Tremor: None Bulk: Normal Tone: Normal  Posture Rounded shoulders, forward head posture and R lateral lean  Gait: Pt. Ambulates with SPC with shuffle gait pattern, decreased B DF, hip extension and knee extension and R lateral lean   AROM (degrees) Limited cervical ROM 2/2 cervical myelopathy All other ROM WFL  Strength (out of 5) R/L 5/4 Shoulder flexion 5/5 Shoulder elevation 5/4+ Shoulder abduction 5/4 Elbow flexion 5/4 Elbow extension 5/5 Wrist flexion 5/5 Wrist extension 3+/3+ Hip flexion 5/5 Hip abduction (seated) 4/4 Hip adduction (seated) 4/4+ Knee extension 5/4 Knee flexion 4+/4+ DF  Grip strength: R 49#, L 24#  Balance Tandem balance: can place foot independently, can hold ~10 sec SLB: pt. Can stand on R LE ~ 3 sec, pt. Unable to SLB on L LE (B Trendelenburg noted) Pt. Needs min CGA when weight shifting  Functional  Tests: Sit to stand: pt. Able to sit to stand without UE assist but demonstrates significant forward lean and needs min CGA from PT in order to stand safely   Treatment  Therex: Ambulating in clinic without SPC (min CGA and VCs needed to incr.  B heel strike and step length) Stair taps to 6 inch step (min CGA assist needed from PT when weight shifting, significant B hip circumduction noted) Tandem and SLB balance assessment (see above) Sit to stand assessment  Reviewed HEP: shld AROM, scap retractions and LAQ   PT Education - 04/04/18 1032    Education Details  see HEP    Person(s) Educated  Patient    Methods  Explanation;Demonstration    Comprehension  Verbalized understanding;Returned demonstration          Plan - 04/04/18 1029    Clinical Impression Statement  Pt. is a pleasant and motivated 53 y/o female who had a TIA in April 2019 and since then pt. reports L UE and LE have been weak and "heavy" and L UE tingling and stabbing pains that are constant. Pt. ambulates into clinic with SPC with shuffle gait pattern, decreased B DF, hip extension and knee extension and moderate R lateral lean. Pt. B AROM WFL and no major ROM deficits were noted by PT. Pt. demonstrates decr. weakness mostly on L side: L shld flexion 4+/5, L bicep/tricep 4/5, L shld abduction 4+/5, B hip flexion 3+/5, B hip add 4/5, L knee ext 4+/5, L knee flex 4/5, B DF 4+. Pt. demonstrates moderate core weakness when sitting on table without back support and demonstrates lateral lean during B MMT. Pt. can tandem balance and places foot independently and can hold ~10 sec. Pt. is able to SLB on R LE for ~3 seconds but unable to SLB on L LE and demonstrates B Trendelenburg. Pt. Needs min CGA when weight shifting during stair taps and during balance test. Pt. Demonstrates moderate B hip circumduction when performing stair taps. Pt. is able to sit to stand without UE assist but demonstrates significant forward lean and needs min  CGA from PT in order to stand safely. Pt. also needs CGA when ambulating in clinic and VCs to initiate B DF and incr. step length. Initial FOTO: 34. Pt will continue to benefit from skilled PT services in order to improve B UE/LE strength and balance in order to normalize gait pattern and improve functional mobility and ADLs without strength limitations.      Clinical Presentation  Evolving    Clinical Decision Making  Moderate    Rehab Potential  Fair    Clinical Impairments Affecting Rehab Potential  (+) motivated, family/ friend support (-) TIA, fall risk     PT Frequency  2x / week    PT Duration  4 weeks    PT Treatment/Interventions  ADLs/Self Care Home Management;Cryotherapy;Electrical Stimulation;Biofeedback;Moist Heat;Therapeutic activities;Therapeutic exercise;Balance training;Neuromuscular re-education;Gait training;Stair training;Functional mobility training;Passive range of motion;Vestibular;Manual techniques;Patient/family education    PT Next Visit Plan  Berg, sit to stand test, TUG, gait training    PT Home Exercise Plan  see handouts    Consulted and Agree with Plan of Care  Patient       Patient will benefit from skilled therapeutic intervention in order to improve the following deficits and impairments:  Abnormal gait, Decreased coordination, Difficulty walking, Impaired UE functional use, Decreased safety awareness, Decreased endurance, Decreased activity tolerance, Pain, Decreased balance, Postural dysfunction, Impaired sensation, Decreased strength, Decreased mobility  Visit Diagnosis: TIA (transient ischemic attack)  Left-sided muscle weakness  Gait abnormality  Risk for falls  Joint stiffness     Problem List There are no active problems to display for this patient.  Cammie McgeeMichael C Sherk, PT, DPT # [id number] [place], [place] 04/04/2018,  5:45 PM  [facility] [address] [place], [place], [address] Phone:  [phone]   Fax:  [phone]  Name: Catherine Avery MRN: [id number] Date of Birth: Jul 02, 1965

## 2018-04-09 ENCOUNTER — Ambulatory Visit: Payer: Medicare Other | Admitting: Physical Therapy

## 2018-04-11 ENCOUNTER — Ambulatory Visit: Payer: Medicare Other | Admitting: Physical Therapy

## 2018-04-16 ENCOUNTER — Encounter: Payer: Disability Insurance | Admitting: Physical Therapy

## 2018-04-18 ENCOUNTER — Ambulatory Visit: Payer: Medicare Other | Admitting: Physical Therapy

## 2018-04-23 ENCOUNTER — Ambulatory Visit: Payer: Medicare Other | Admitting: Physical Therapy

## 2018-04-25 ENCOUNTER — Ambulatory Visit: Payer: Medicare Other | Admitting: Physical Therapy

## 2018-09-07 ENCOUNTER — Other Ambulatory Visit: Payer: Self-pay | Admitting: Internal Medicine

## 2018-09-07 DIAGNOSIS — R519 Headache, unspecified: Secondary | ICD-10-CM

## 2018-09-07 DIAGNOSIS — R531 Weakness: Secondary | ICD-10-CM

## 2018-09-21 ENCOUNTER — Ambulatory Visit
Admission: RE | Admit: 2018-09-21 | Discharge: 2018-09-21 | Disposition: A | Discharge status: Home / Self Care | Payer: Medicare Other | Source: Ambulatory Visit | Attending: Internal Medicine | Admitting: Internal Medicine

## 2018-09-21 ENCOUNTER — Other Ambulatory Visit: Payer: Self-pay

## 2018-09-21 DIAGNOSIS — R519 Headache, unspecified: Secondary | ICD-10-CM

## 2018-09-21 DIAGNOSIS — R531 Weakness: Secondary | ICD-10-CM | POA: Insufficient documentation

## 2018-09-21 DIAGNOSIS — R51 Headache: Secondary | ICD-10-CM | POA: Insufficient documentation

## 2018-11-16 ENCOUNTER — Emergency Department: Payer: No Typology Code available for payment source

## 2018-11-16 ENCOUNTER — Emergency Department
Admission: EM | Admit: 2018-11-16 | Discharge: 2018-11-16 | Disposition: A | Discharge status: Other Acute Inpatient Hospital | Payer: No Typology Code available for payment source | Attending: Emergency Medicine | Admitting: Emergency Medicine

## 2018-11-16 ENCOUNTER — Other Ambulatory Visit: Payer: Self-pay

## 2018-11-16 ENCOUNTER — Encounter: Payer: Self-pay | Admitting: Emergency Medicine

## 2018-11-16 DIAGNOSIS — I469 Cardiac arrest, cause unspecified: Secondary | ICD-10-CM

## 2018-11-16 DIAGNOSIS — Y999 Unspecified external cause status: Secondary | ICD-10-CM | POA: Diagnosis not present

## 2018-11-16 DIAGNOSIS — S12190A Other displaced fracture of second cervical vertebra, initial encounter for closed fracture: Secondary | ICD-10-CM | POA: Diagnosis not present

## 2018-11-16 DIAGNOSIS — Z79899 Other long term (current) drug therapy: Secondary | ICD-10-CM | POA: Diagnosis not present

## 2018-11-16 DIAGNOSIS — Z20828 Contact with and (suspected) exposure to other viral communicable diseases: Secondary | ICD-10-CM | POA: Diagnosis not present

## 2018-11-16 DIAGNOSIS — Y939 Activity, unspecified: Secondary | ICD-10-CM | POA: Insufficient documentation

## 2018-11-16 DIAGNOSIS — R4182 Altered mental status, unspecified: Secondary | ICD-10-CM | POA: Diagnosis not present

## 2018-11-16 DIAGNOSIS — Y92410 Unspecified street and highway as the place of occurrence of the external cause: Secondary | ICD-10-CM | POA: Insufficient documentation

## 2018-11-16 LAB — CBC WITH DIFFERENTIAL/PLATELET
Abs Immature Granulocytes: 0.18 10*3/uL — ABNORMAL HIGH (ref 0.00–0.07)
Basophils Absolute: 0 10*3/uL (ref 0.0–0.1)
Basophils Relative: 1 %
Eosinophils Absolute: 0.1 10*3/uL (ref 0.0–0.5)
Eosinophils Relative: 3 %
HCT: 35 % — ABNORMAL LOW (ref 36.0–46.0)
Hemoglobin: 11.5 g/dL — ABNORMAL LOW (ref 12.0–15.0)
Immature Granulocytes: 4 %
Lymphocytes Relative: 24 %
Lymphs Abs: 1 10*3/uL (ref 0.7–4.0)
MCH: 26.6 pg (ref 26.0–34.0)
MCHC: 32.9 g/dL (ref 30.0–36.0)
MCV: 81 fL (ref 80.0–100.0)
Monocytes Absolute: 0.2 10*3/uL (ref 0.1–1.0)
Monocytes Relative: 5 %
Neutro Abs: 2.7 10*3/uL (ref 1.7–7.7)
Neutrophils Relative %: 63 %
Platelets: 196 10*3/uL (ref 150–400)
RBC: 4.32 MIL/uL (ref 3.87–5.11)
RDW: 13.5 % (ref 11.5–15.5)
WBC: 4.3 10*3/uL (ref 4.0–10.5)
nRBC: 0 % (ref 0.0–0.2)

## 2018-11-16 LAB — COMPREHENSIVE METABOLIC PANEL
ALT: 74 U/L — ABNORMAL HIGH (ref 0–44)
AST: 73 U/L — ABNORMAL HIGH (ref 15–41)
Albumin: 3.8 g/dL (ref 3.5–5.0)
Alkaline Phosphatase: 83 U/L (ref 38–126)
Anion gap: 10 (ref 5–15)
BUN: 13 mg/dL (ref 6–20)
CO2: 24 mmol/L (ref 22–32)
Calcium: 8.8 mg/dL — ABNORMAL LOW (ref 8.9–10.3)
Chloride: 110 mmol/L (ref 98–111)
Creatinine, Ser: 0.62 mg/dL (ref 0.44–1.00)
GFR calc Af Amer: >60 mL/min (ref >60)
GFR calc non Af Amer: >60 mL/min (ref >60)
Glucose, Bld: 140 mg/dL — ABNORMAL HIGH (ref 70–99)
Potassium: 3.2 mmol/L — ABNORMAL LOW (ref 3.5–5.1)
Sodium: 144 mmol/L (ref 135–145)
Total Bilirubin: 0.9 mg/dL (ref 0.3–1.2)
Total Protein: 7 g/dL (ref 6.5–8.1)

## 2018-11-16 LAB — BLOOD GAS, VENOUS
Acid-Base Excess: 1.7 mmol/L (ref 0.0–2.0)
Bicarbonate: 27.2 mmol/L (ref 20.0–28.0)
FIO2: 0.8
MECHVT: 450 mL
O2 Saturation: 99 %
PEEP: 5 cmH2O
Patient temperature: 37
RATE: 20 resp/min
pCO2, Ven: 45 mmHg (ref 44.0–60.0)
pH, Ven: 7.39 (ref 7.250–7.430)
pO2, Ven: 135 mmHg — ABNORMAL HIGH (ref 32.0–45.0)

## 2018-11-16 LAB — URINALYSIS, ROUTINE W REFLEX MICROSCOPIC
Bacteria, UA: NONE SEEN
Bilirubin Urine: NEGATIVE
Glucose, UA: NEGATIVE mg/dL
Ketones, ur: NEGATIVE mg/dL
Leukocytes,Ua: NEGATIVE
Nitrite: NEGATIVE
Protein, ur: 30 mg/dL — AB
Specific Gravity, Urine: 1.009 (ref 1.005–1.030)
pH: 7 (ref 5.0–8.0)

## 2018-11-16 LAB — TYPE AND SCREEN
ABO/RH(D): A POS
Antibody Screen: NEGATIVE

## 2018-11-16 LAB — SALICYLATE LEVEL: Salicylate Lvl: <7 mg/dL (ref 2.8–30.0)

## 2018-11-16 LAB — GLUCOSE, CAPILLARY: Glucose-Capillary: 129 mg/dL — ABNORMAL HIGH (ref 70–99)

## 2018-11-16 LAB — ACETAMINOPHEN LEVEL: Acetaminophen (Tylenol), Serum: <10 ug/mL — ABNORMAL LOW (ref 10–30)

## 2018-11-16 LAB — LACTIC ACID, PLASMA: Lactic Acid, Venous: 1.4 mmol/L (ref 0.5–1.9)

## 2018-11-16 LAB — URINE DRUG SCREEN, QUALITATIVE (ARMC ONLY)
Amphetamines, Ur Screen: NOT DETECTED
Barbiturates, Ur Screen: NOT DETECTED
Benzodiazepine, Ur Scrn: POSITIVE — AB
Cannabinoid 50 Ng, Ur ~~LOC~~: NOT DETECTED
Cocaine Metabolite,Ur ~~LOC~~: NOT DETECTED
MDMA (Ecstasy)Ur Screen: NOT DETECTED
Methadone Scn, Ur: NOT DETECTED
Opiate, Ur Screen: NOT DETECTED
Phencyclidine (PCP) Ur S: NOT DETECTED
Tricyclic, Ur Screen: NOT DETECTED

## 2018-11-16 LAB — TROPONIN I (HIGH SENSITIVITY)
Troponin I (High Sensitivity): 4 ng/L (ref <18)
Troponin I (High Sensitivity): 3 ng/L (ref <18)

## 2018-11-16 LAB — T4, FREE: Free T4: 0.98 ng/dL (ref 0.61–1.12)

## 2018-11-16 LAB — ETHANOL: Alcohol, Ethyl (B): <10 mg/dL (ref <10)

## 2018-11-16 LAB — APTT: aPTT: 32 seconds (ref 24–36)

## 2018-11-16 LAB — SARS CORONAVIRUS 2 BY RT PCR (HOSPITAL ORDER, PERFORMED IN ~~LOC~~ HOSPITAL LAB): SARS Coronavirus 2: NEGATIVE

## 2018-11-16 LAB — TSH: TSH: 2.465 u[IU]/mL (ref 0.350–4.500)

## 2018-11-16 LAB — PROTIME-INR
INR: 1.1 (ref 0.8–1.2)
Prothrombin Time: 13.9 seconds (ref 11.4–15.2)

## 2018-11-16 LAB — LIPASE, BLOOD: Lipase: 20 U/L (ref 11–51)

## 2018-11-16 MED ORDER — FENTANYL 2500MCG IN NS 250ML (10MCG/ML) PREMIX INFUSION
INTRAVENOUS | Status: AC
  Administered 2018-11-16: 50 ug/h via INTRAVENOUS
  Filled 2018-11-16: qty 250

## 2018-11-16 MED ORDER — FENTANYL CITRATE (PF) 100 MCG/2ML IJ SOLN
INTRAMUSCULAR | Status: AC | PRN
  Administered 2018-11-16: 75 ug via INTRAVENOUS

## 2018-11-16 MED ORDER — MIDAZOLAM HCL 5 MG/5ML IJ SOLN
INTRAMUSCULAR | Status: AC | PRN
  Administered 2018-11-16: 2 mg via INTRAVENOUS

## 2018-11-16 MED ORDER — MIDAZOLAM HCL 2 MG/2ML IJ SOLN
2.0000 mg | Freq: Once | INTRAMUSCULAR | Status: AC
  Administered 2018-11-16: 2 mg via INTRAVENOUS

## 2018-11-16 MED ORDER — IOHEXOL 350 MG/ML SOLN
75.0000 mL | Freq: Once | INTRAVENOUS | Status: AC | PRN
  Administered 2018-11-16: 15:00:00 75 mL via INTRAVENOUS

## 2018-11-16 MED ORDER — MIDAZOLAM HCL 2 MG/2ML IJ SOLN
INTRAMUSCULAR | Status: AC
  Filled 2018-11-16: qty 2

## 2018-11-16 MED ORDER — SODIUM BICARBONATE 8.4 % IV SOLN
INTRAVENOUS | Status: AC | PRN
  Administered 2018-11-16: 50 meq via INTRAVENOUS

## 2018-11-16 MED ORDER — FENTANYL 2500MCG IN NS 250ML (10MCG/ML) PREMIX INFUSION
0.0000 ug/h | INTRAVENOUS | Status: DC
  Administered 2018-11-16: 15:00:00 50 ug/h via INTRAVENOUS

## 2018-11-16 MED ORDER — PROPOFOL 1000 MG/100ML IV EMUL
INTRAVENOUS | Status: AC
  Administered 2018-11-16: 5 ug/kg/min via INTRAVENOUS
  Filled 2018-11-16: qty 100

## 2018-11-16 MED ORDER — ROCURONIUM BROMIDE 50 MG/5ML IV SOLN
INTRAVENOUS | Status: AC | PRN
  Administered 2018-11-16: 100 mg via INTRAVENOUS

## 2018-11-16 MED ORDER — NOREPINEPHRINE 4 MG/250ML-% IV SOLN
INTRAVENOUS | Status: AC
  Filled 2018-11-16: qty 250

## 2018-11-16 MED ORDER — FENTANYL CITRATE (PF) 100 MCG/2ML IJ SOLN
50.0000 ug | Freq: Once | INTRAMUSCULAR | Status: AC
  Administered 2018-11-16: 17:00:00 50 ug via INTRAVENOUS

## 2018-11-16 MED ORDER — PROPOFOL 1000 MG/100ML IV EMUL
5.0000 ug/kg/min | INTRAVENOUS | Status: DC
  Administered 2018-11-16: 15:00:00 5 ug/kg/min via INTRAVENOUS

## 2018-11-16 MED ORDER — DEXAMETHASONE SODIUM PHOSPHATE 10 MG/ML IJ SOLN
10.0000 mg | Freq: Once | INTRAMUSCULAR | Status: AC
  Administered 2018-11-16: 17:00:00 10 mg via INTRAVENOUS
  Filled 2018-11-16: qty 1

## 2018-11-16 MED ORDER — MIDAZOLAM HCL 2 MG/2ML IJ SOLN
2.0000 mg | Freq: Once | INTRAMUSCULAR | Status: AC
  Administered 2018-11-16: 19:00:00 2 mg via INTRAVENOUS

## 2018-11-16 MED ORDER — ETOMIDATE 2 MG/ML IV SOLN
INTRAVENOUS | Status: AC | PRN
  Administered 2018-11-16: 20 mg via INTRAVENOUS

## 2018-11-16 NOTE — ED Notes (Addendum)
Consent for transfer obtained from patient's sister, Lindell Noe. Pt's sister signed consent for patient to be transferred to [facility].

## 2018-11-16 NOTE — ED Notes (Signed)
Pt's sister at bedside at this time. This RN spoke with MD regarding pt being awake and tracking with her eyes. VORB for 39mcg bolus via infusion and spoke with MD regarding increasing fentanyl infusion.

## 2018-11-16 NOTE — ED Notes (Signed)
Remainder of Fentanyl drip wasted with Anna in [place].

## 2018-11-16 NOTE — ED Notes (Signed)
Pt transferred to [facility] ER via [employer] at this time.

## 2018-11-16 NOTE — ED Notes (Signed)
Called [facility] for transfer [phone]
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
## 2018-11-16 NOTE — ED Notes (Signed)
Called [facility] for transport  [phone]

## 2018-11-16 NOTE — ED Notes (Signed)
MD aware of pt's temp. States to maintain between 34-35 degree celsius.

## 2018-11-16 NOTE — ED Notes (Signed)
300 mcg fentanyl pulled from Fentanyl drip and transferred to [facility] for transport.
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
## 2018-11-16 NOTE — ED Notes (Signed)
This RN reviewed EMTALA 

## 2018-11-16 NOTE — ED Notes (Signed)
373mcg pulled from fentanyl infusion and given to [facility] for transport.

## 2018-11-16 NOTE — ED Notes (Signed)
Pt's O2 sats 89%, this RN spoke with RT, pt's FiO2 turn up to 60%.

## 2018-11-16 NOTE — ED Notes (Signed)
Pt transported to CT by this RN, Claiborne Billings, RN, and RT. Pt returned from CT at this time. Pt tolerated CT well. Will continue to monitor.

## 2018-11-16 NOTE — ED Notes (Signed)
Pt noted to become increasingly agitated while [facility] attempting to put patient on back board. VORB for 2mg  Versed IV push from Dr. Charna Archer to keep patient comfortable and assist patient with staying still during transfer.

## 2018-11-16 NOTE — ED Notes (Addendum)
Pt noted to be awake, tracking, and following commands, pt acknowledges that she is able to feel sensation on her face and her arms. Warm blankets applied per Dr. Charna Archer who wants temp to be increased from 94 degress farenheit.

## 2018-11-16 NOTE — ED Notes (Signed)
[facility] at bedside to transfer patient.  

## 2018-11-16 NOTE — ED Notes (Signed)
1 gray ring, 1 gray bracelet and  Pair of yellow ear rings taken from patient and placed in a pts belonging bag with pts shoes and purse. Belongings bag at pts bedside next to sink.
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
## 2019-02-27 ENCOUNTER — Other Ambulatory Visit: Payer: Self-pay | Admitting: Internal Medicine

## 2019-02-27 DIAGNOSIS — Z1231 Encounter for screening mammogram for malignant neoplasm of breast: Secondary | ICD-10-CM

## 2019-04-01 ENCOUNTER — Ambulatory Visit: Payer: Disability Insurance

## 2019-04-08 ENCOUNTER — Ambulatory Visit: Payer: Disability Insurance

## 2019-04-22 ENCOUNTER — Other Ambulatory Visit: Payer: Self-pay

## 2019-04-22 ENCOUNTER — Ambulatory Visit
Admission: RE | Admit: 2019-04-22 | Discharge: 2019-04-22 | Disposition: A | Discharge status: Home / Self Care | Payer: Medicare Other | Source: Ambulatory Visit | Attending: Internal Medicine | Admitting: Internal Medicine

## 2019-04-22 DIAGNOSIS — Z1231 Encounter for screening mammogram for malignant neoplasm of breast: Secondary | ICD-10-CM | POA: Diagnosis present

## 2019-12-27 ENCOUNTER — Emergency Department: Payer: Medicare Other

## 2019-12-27 ENCOUNTER — Emergency Department
Admission: EM | Admit: 2019-12-27 | Discharge: 2019-12-27 | Disposition: A | Discharge status: Home / Self Care | Payer: Medicare Other | Attending: Emergency Medicine | Admitting: Emergency Medicine

## 2019-12-27 ENCOUNTER — Other Ambulatory Visit: Payer: Self-pay

## 2019-12-27 DIAGNOSIS — S0990XA Unspecified injury of head, initial encounter: Secondary | ICD-10-CM | POA: Diagnosis not present

## 2019-12-27 DIAGNOSIS — M25512 Pain in left shoulder: Secondary | ICD-10-CM | POA: Diagnosis not present

## 2019-12-27 DIAGNOSIS — M25561 Pain in right knee: Secondary | ICD-10-CM | POA: Insufficient documentation

## 2019-12-27 DIAGNOSIS — W19XXXA Unspecified fall, initial encounter: Secondary | ICD-10-CM

## 2019-12-27 DIAGNOSIS — M25562 Pain in left knee: Secondary | ICD-10-CM | POA: Diagnosis not present

## 2019-12-27 DIAGNOSIS — W0110XA Fall on same level from slipping, tripping and stumbling with subsequent striking against unspecified object, initial encounter: Secondary | ICD-10-CM | POA: Diagnosis not present

## 2019-12-27 NOTE — Discharge Instructions (Signed)
Continue to take Tylenol as needed for pain.

## 2019-12-27 NOTE — ED Notes (Signed)
Pt assisted to stretcher by this RN and Celine Mans, Charity fundraiser. Pt c/o [ain to L shoulder, and chronic pain to bilateral legs. Pt states she is very weak.  Pt uses WC and walker at home to transfer.

## 2019-12-27 NOTE — ED Notes (Signed)
First Nurse Note: Pt to ED via EMS for fall. Pt did hit head, denies LOC, no blood thinners. Pt urinated on herself when she got to the hosptial per EMS.

## 2019-12-27 NOTE — ED Notes (Signed)
Patient was incontinent of urine. Patient was cleaned, new blue paper scrubs and brief were placed on patient. Catherine Avery assisted with this writer to get the patient in the wheelchair. Patient's sister at bedside.
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
## 2020-06-05 ENCOUNTER — Other Ambulatory Visit: Payer: Self-pay | Admitting: Internal Medicine

## 2020-06-05 DIAGNOSIS — Z1231 Encounter for screening mammogram for malignant neoplasm of breast: Secondary | ICD-10-CM

## 2020-06-19 ENCOUNTER — Other Ambulatory Visit: Payer: Self-pay

## 2020-06-19 ENCOUNTER — Ambulatory Visit
Admission: RE | Admit: 2020-06-19 | Discharge: 2020-06-19 | Disposition: A | Discharge status: Home / Self Care | Payer: Medicare Other | Source: Ambulatory Visit | Attending: Internal Medicine | Admitting: Internal Medicine

## 2020-06-19 DIAGNOSIS — Z1231 Encounter for screening mammogram for malignant neoplasm of breast: Secondary | ICD-10-CM | POA: Diagnosis not present

## 2020-07-14 ENCOUNTER — Encounter: Payer: Self-pay | Admitting: Emergency Medicine

## 2020-07-14 ENCOUNTER — Other Ambulatory Visit: Payer: Self-pay

## 2020-07-14 ENCOUNTER — Emergency Department
Admission: EM | Admit: 2020-07-14 | Discharge: 2020-07-14 | Disposition: A | Discharge status: Home / Self Care | Payer: Medicare Other | Attending: Emergency Medicine | Admitting: Emergency Medicine

## 2020-07-14 DIAGNOSIS — Z950 Presence of cardiac pacemaker: Secondary | ICD-10-CM | POA: Insufficient documentation

## 2020-07-14 DIAGNOSIS — Z91040 Latex allergy status: Secondary | ICD-10-CM | POA: Insufficient documentation

## 2020-07-14 DIAGNOSIS — S30860D Insect bite (nonvenomous) of lower back and pelvis, subsequent encounter: Secondary | ICD-10-CM | POA: Diagnosis not present

## 2020-07-14 DIAGNOSIS — W57XXXA Bitten or stung by nonvenomous insect and other nonvenomous arthropods, initial encounter: Secondary | ICD-10-CM

## 2020-07-14 DIAGNOSIS — W57XXXD Bitten or stung by nonvenomous insect and other nonvenomous arthropods, subsequent encounter: Secondary | ICD-10-CM | POA: Insufficient documentation

## 2020-07-14 MED ORDER — DOXYCYCLINE HYCLATE 100 MG PO TABS: 100.0000 mg | ORAL_TABLET | Freq: Once | ORAL | Status: DC

## 2020-07-14 MED ORDER — DOXYCYCLINE HYCLATE 100 MG PO TABS
200.0000 mg | ORAL_TABLET | Freq: Once | ORAL | Status: AC
  Administered 2020-07-14: 200 mg via ORAL
  Filled 2020-07-14: qty 2

## 2020-07-14 NOTE — ED Triage Notes (Signed)
Pt to triage via w/c with no distress noted; pt reports friend removing tick from back tonight and unsure if all was removed
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
## 2020-07-14 NOTE — Discharge Instructions (Signed)
You have been treated prophylactically in the ED. no need fracture antibiotics at home.  Keep the area dry and clean.  Wash with warm water and soap.  It is okay to put topical antibiotic like bacitracin over it.  If he develops rash, fever, headache please return to the emergency room

## 2020-10-21 ENCOUNTER — Other Ambulatory Visit: Payer: Self-pay | Admitting: Neurology

## 2020-10-21 DIAGNOSIS — M542 Cervicalgia: Secondary | ICD-10-CM

## 2020-11-03 ENCOUNTER — Ambulatory Visit: Payer: Medicare Other

## 2020-11-06 ENCOUNTER — Other Ambulatory Visit: Payer: Self-pay

## 2020-11-06 ENCOUNTER — Ambulatory Visit
Admission: RE | Admit: 2020-11-06 | Discharge: 2020-11-06 | Disposition: A | Discharge status: Home / Self Care | Payer: Medicare Other | Source: Ambulatory Visit | Attending: Neurology | Admitting: Neurology

## 2020-11-06 DIAGNOSIS — M542 Cervicalgia: Secondary | ICD-10-CM | POA: Insufficient documentation

## 2021-05-25 ENCOUNTER — Other Ambulatory Visit: Payer: Self-pay | Admitting: Internal Medicine

## 2021-05-25 DIAGNOSIS — Z1231 Encounter for screening mammogram for malignant neoplasm of breast: Secondary | ICD-10-CM

## 2021-06-29 ENCOUNTER — Ambulatory Visit
Admission: RE | Admit: 2021-06-29 | Discharge: 2021-06-29 | Disposition: A | Discharge status: Home / Self Care | Payer: Medicare Other | Source: Ambulatory Visit | Attending: Internal Medicine | Admitting: Internal Medicine

## 2021-06-29 DIAGNOSIS — Z1231 Encounter for screening mammogram for malignant neoplasm of breast: Secondary | ICD-10-CM | POA: Diagnosis not present

## 2021-07-16 ENCOUNTER — Emergency Department: Payer: Medicare Other

## 2021-07-16 ENCOUNTER — Other Ambulatory Visit: Payer: Self-pay

## 2021-07-16 ENCOUNTER — Emergency Department
Admission: EM | Admit: 2021-07-16 | Discharge: 2021-07-16 | Disposition: A | Discharge status: Home / Self Care | Payer: Medicare Other | Attending: Emergency Medicine | Admitting: Emergency Medicine

## 2021-07-16 DIAGNOSIS — G459 Transient cerebral ischemic attack, unspecified: Secondary | ICD-10-CM | POA: Diagnosis not present

## 2021-07-16 DIAGNOSIS — Z950 Presence of cardiac pacemaker: Secondary | ICD-10-CM | POA: Diagnosis not present

## 2021-07-16 DIAGNOSIS — G9589 Other specified diseases of spinal cord: Secondary | ICD-10-CM

## 2021-07-16 DIAGNOSIS — E119 Type 2 diabetes mellitus without complications: Secondary | ICD-10-CM | POA: Insufficient documentation

## 2021-07-16 DIAGNOSIS — R29810 Facial weakness: Secondary | ICD-10-CM | POA: Insufficient documentation

## 2021-07-16 DIAGNOSIS — I639 Cerebral infarction, unspecified: Secondary | ICD-10-CM | POA: Insufficient documentation

## 2021-07-16 DIAGNOSIS — R531 Weakness: Secondary | ICD-10-CM | POA: Insufficient documentation

## 2021-07-16 LAB — CBC
HCT: 35.1 % — ABNORMAL LOW (ref 36.0–46.0)
Hemoglobin: 11.4 g/dL — ABNORMAL LOW (ref 12.0–15.0)
MCH: 27.9 pg (ref 26.0–34.0)
MCHC: 32.5 g/dL (ref 30.0–36.0)
MCV: 86 fL (ref 80.0–100.0)
Platelets: 168 10*3/uL (ref 150–400)
RBC: 4.08 MIL/uL (ref 3.87–5.11)
RDW: 12.6 % (ref 11.5–15.5)
WBC: 4.8 10*3/uL (ref 4.0–10.5)
nRBC: 0 % (ref 0.0–0.2)

## 2021-07-16 LAB — COMPREHENSIVE METABOLIC PANEL
ALT: 12 U/L (ref 0–44)
AST: 15 U/L (ref 15–41)
Albumin: 3.8 g/dL (ref 3.5–5.0)
Alkaline Phosphatase: 67 U/L (ref 38–126)
Anion gap: 4 — ABNORMAL LOW (ref 5–15)
BUN: 14 mg/dL (ref 6–20)
CO2: 28 mmol/L (ref 22–32)
Calcium: 9.3 mg/dL (ref 8.9–10.3)
Chloride: 110 mmol/L (ref 98–111)
Creatinine, Ser: 0.74 mg/dL (ref 0.44–1.00)
GFR, Estimated: >60 mL/min (ref >60)
Glucose, Bld: 80 mg/dL (ref 70–99)
Potassium: 3.8 mmol/L (ref 3.5–5.1)
Sodium: 142 mmol/L (ref 135–145)
Total Bilirubin: 0.6 mg/dL (ref 0.3–1.2)
Total Protein: 6.9 g/dL (ref 6.5–8.1)

## 2021-07-16 LAB — DIFFERENTIAL
Abs Immature Granulocytes: 0.01 10*3/uL (ref 0.00–0.07)
Basophils Absolute: 0 10*3/uL (ref 0.0–0.1)
Basophils Relative: 1 %
Eosinophils Absolute: 1 10*3/uL — ABNORMAL HIGH (ref 0.0–0.5)
Eosinophils Relative: 21 %
Immature Granulocytes: 0 %
Lymphocytes Relative: 19 %
Lymphs Abs: 0.9 10*3/uL (ref 0.7–4.0)
Monocytes Absolute: 0.4 10*3/uL (ref 0.1–1.0)
Monocytes Relative: 8 %
Neutro Abs: 2.5 10*3/uL (ref 1.7–7.7)
Neutrophils Relative %: 51 %

## 2021-07-16 LAB — PROTIME-INR
INR: 1 (ref 0.8–1.2)
Prothrombin Time: 13.3 seconds (ref 11.4–15.2)

## 2021-07-16 LAB — CBG MONITORING, ED: Glucose-Capillary: 83 mg/dL (ref 70–99)

## 2021-07-16 LAB — APTT: aPTT: 30 seconds (ref 24–36)

## 2021-07-16 MED ORDER — SODIUM CHLORIDE 0.9% FLUSH
3.0000 mL | Freq: Once | INTRAVENOUS | Status: AC
  Administered 2021-07-16: 3 mL via INTRAVENOUS
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
## 2021-07-16 NOTE — Progress Notes (Signed)
   07/16/21 1617  Clinical Encounter Type  Visited With Patient not available   Chaplain Burris aware of code stroke but unable to see pt while in CT. Chapl;ain B was then called elsewhere; will alert chaplain coming in for overnight to provide follow-up or please page if there are spiritual or emotional needs for support.

## 2021-07-16 NOTE — ED Notes (Signed)
E-signature pad unavailable - Pt verbalized understanding of D/C information - no additional concerns at this time.  

## 2021-07-16 NOTE — ED Provider Notes (Signed)
[facility] Provider Note    Event Date/Time   First MD Initiated Contact with Patient 07/16/21 1343     (approximate)   History   Code Stroke   HPI  Catherine Avery is a 56 y.o. female with a history of cervical myelopathy, diabetes, neuropathy, pacemaker who presents for evaluation for left-sided weakness.  Patient went to neurology appointment today for her first visit, was referred to the emergency department given left-sided facial droop and weakness in the left arm and left leg.  Code stroke activated in triage     Physical Exam   Triage Vital Signs: ED Triage Vitals  Enc Vitals Group     BP 07/16/21 1334 (!) 93/46     Pulse Rate 07/16/21 1334 60     Resp 07/16/21 1334 17     Temp 07/16/21 1334 98 F (36.7 C)     Temp Source 07/16/21 1334 Oral     SpO2 07/16/21 1334 99 %     Weight 07/16/21 1340 83.9 kg (185 lb)     Height --      Head Circumference --      Peak Flow --      Pain Score --      Pain Loc --      Pain Edu? --      Excl. in GC? --     Most recent vital signs: Vitals:   07/16/21 2000 07/16/21 2125  BP: 122/77 120/70  Pulse: (!) 50 (!) 53  Resp: 20 18  Temp:  98.3 F (36.8 C)  SpO2: 100% 98%     General: Awake, no distress.  CV:  Good peripheral perfusion.  Resp:  Normal effort.  Abd:  No distention.  Other:  Left arm and left leg weakness   ED Results / Procedures / Treatments   Labs (all labs ordered are listed, but only abnormal results are displayed) Labs Reviewed  CBC - Abnormal; Notable for the following components:      Result Value   Hemoglobin 11.4 (*)    HCT 35.1 (*)    All other components within normal limits  DIFFERENTIAL - Abnormal; Notable for the following components:   Eosinophils Absolute 1.0 (*)    All other components within normal limits  COMPREHENSIVE METABOLIC PANEL - Abnormal; Notable for the following components:   Anion gap 4 (*)    All other components within normal limits   PROTIME-INR  APTT  I-STAT CREATININE, ED  CBG MONITORING, ED  POC URINE PREG, ED     EKG  ED ECG REPORT I, Jene Every, the attending physician, personally viewed and interpreted this ECG.  Date: 07/16/2021  Rhythm: Junctional QRS Axis: normal Intervals: Abnormal ST/T Wave abnormalities: normal Narrative Interpretation: no evidence of acute ischemia    RADIOLOGY CT head viewed interpret by me, no acute abnormality    PROCEDURES:  Critical Care performed: no  Procedures   MEDICATIONS ORDERED IN ED: Medications  sodium chloride flush (NS) 0.9 % injection 3 mL (3 mLs Intravenous Given 07/16/21 1407)     IMPRESSION / MDM / ASSESSMENT AND PLAN / ED COURSE  I reviewed the triage vital signs and the nursing notes. Patient's presentation is most consistent with acute presentation with potential threat to life or bodily function.  Patient presents with reports of left-sided weakness.  On my evaluation of medical records, at patient's recent office visit appears that she has left arm and left leg weakness from  myelopathy chronically.  Patient seen by neurologist Dr. Otelia Limes rapidly, appreciate his consultation, he feels code stroke can be deactivated as patient's symptoms are primarily chronic  Lab work reviewed, CBC CMP coags normal.  CT head is unremarkable.  Neurology is performing further evaluation, have asked my colleague to follow-up on neurology recommendations        FINAL CLINICAL IMPRESSION(S) / ED DIAGNOSES   Final diagnoses:  TIA (transient ischemic attack)     Rx / DC Orders   ED Discharge Orders     None        Note:  This document was prepared using Dragon voice recognition software and may include unintentional dictation errors.   Jene Every, MD 07/19/21 [phone]

## 2021-07-16 NOTE — Progress Notes (Signed)
Code stroke activated at 1330. Seen in triage b provider and taken to CT. Returned to room at 1353. Staff reports patient was seen in CT by Dr. Otelia Limes.

## 2021-07-16 NOTE — ED Notes (Addendum)
Dr. Cheral Marker @ the bedside.

## 2021-07-16 NOTE — ED Triage Notes (Addendum)
From Neuro office- Dr Sherryll Burger with reports of left sided numbness and weakness that began at 1200 with headache. Code stroke called to charge RN at 1328

## 2021-07-16 NOTE — ED Provider Notes (Addendum)
-----------------------------------------   9:06 PM on 07/16/2021 ----------------------------------------- Neurology is currently evaluating the patient. Patient's work-up thus far has been largely nonrevealing.  Negative/normal CT scan.  Lab work shows no concerning abnormality.  Patient states she is feeling better but continues to have some heaviness in the left upper extremity.  However patient is requesting to leave.  States she has been here for over 7 hours and wishes to leave.   Harvest Dark, MD 07/16/21 2133

## 2022-06-07 ENCOUNTER — Other Ambulatory Visit: Payer: Self-pay | Admitting: Internal Medicine

## 2022-06-07 DIAGNOSIS — Z1231 Encounter for screening mammogram for malignant neoplasm of breast: Secondary | ICD-10-CM

## 2022-07-04 ENCOUNTER — Ambulatory Visit
Admission: RE | Admit: 2022-07-04 | Discharge: 2022-07-04 | Disposition: A | Discharge status: Home / Self Care | Payer: Medicare Other | Source: Ambulatory Visit | Attending: Internal Medicine | Admitting: Internal Medicine

## 2022-07-04 DIAGNOSIS — Z1231 Encounter for screening mammogram for malignant neoplasm of breast: Secondary | ICD-10-CM | POA: Diagnosis present
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
## 2022-07-05 ENCOUNTER — Ambulatory Visit: Payer: Medicare Other | Attending: Internal Medicine | Admitting: Physical Therapy

## 2022-07-05 ENCOUNTER — Encounter: Payer: Self-pay | Admitting: Physical Therapy

## 2022-07-05 DIAGNOSIS — R2689 Other abnormalities of gait and mobility: Secondary | ICD-10-CM | POA: Insufficient documentation

## 2022-07-05 DIAGNOSIS — Z9181 History of falling: Secondary | ICD-10-CM | POA: Diagnosis present

## 2022-07-05 DIAGNOSIS — R262 Difficulty in walking, not elsewhere classified: Secondary | ICD-10-CM | POA: Diagnosis present

## 2022-07-06 ENCOUNTER — Encounter: Payer: Self-pay | Admitting: Physical Therapy

## 2022-07-07 ENCOUNTER — Ambulatory Visit: Payer: Medicare Other | Admitting: Physical Therapy

## 2022-07-07 DIAGNOSIS — R2689 Other abnormalities of gait and mobility: Secondary | ICD-10-CM | POA: Diagnosis not present

## 2022-07-07 DIAGNOSIS — R262 Difficulty in walking, not elsewhere classified: Secondary | ICD-10-CM

## 2022-07-07 DIAGNOSIS — Z9181 History of falling: Secondary | ICD-10-CM
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
## 2022-07-12 ENCOUNTER — Encounter: Payer: Self-pay | Admitting: Physical Therapy

## 2022-07-12 ENCOUNTER — Ambulatory Visit: Payer: Medicare Other

## 2022-07-12 DIAGNOSIS — R2689 Other abnormalities of gait and mobility: Secondary | ICD-10-CM

## 2022-07-12 DIAGNOSIS — Z9181 History of falling: Secondary | ICD-10-CM
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
## 2022-07-14 ENCOUNTER — Encounter: Payer: Self-pay | Admitting: Physical Therapy

## 2022-07-14 ENCOUNTER — Ambulatory Visit: Payer: Medicare Other

## 2022-07-14 DIAGNOSIS — Z9181 History of falling: Secondary | ICD-10-CM

## 2022-07-14 DIAGNOSIS — R262 Difficulty in walking, not elsewhere classified: Secondary | ICD-10-CM

## 2022-07-14 DIAGNOSIS — R2689 Other abnormalities of gait and mobility: Secondary | ICD-10-CM
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
## 2022-07-19 ENCOUNTER — Ambulatory Visit: Payer: Medicare Other | Attending: Internal Medicine | Admitting: Physical Therapy

## 2022-07-19 DIAGNOSIS — Z9181 History of falling: Secondary | ICD-10-CM | POA: Insufficient documentation

## 2022-07-19 DIAGNOSIS — R2689 Other abnormalities of gait and mobility: Secondary | ICD-10-CM | POA: Diagnosis present

## 2022-07-19 DIAGNOSIS — R262 Difficulty in walking, not elsewhere classified: Secondary | ICD-10-CM | POA: Diagnosis present
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
## 2022-07-20 ENCOUNTER — Encounter: Payer: Self-pay | Admitting: Physical Therapy

## 2022-07-21 ENCOUNTER — Ambulatory Visit: Payer: Medicare Other | Admitting: Physical Therapy

## 2022-07-21 ENCOUNTER — Encounter: Payer: Self-pay | Admitting: Physical Therapy

## 2022-07-21 DIAGNOSIS — R262 Difficulty in walking, not elsewhere classified: Secondary | ICD-10-CM

## 2022-07-21 DIAGNOSIS — Z9181 History of falling: Secondary | ICD-10-CM

## 2022-07-21 DIAGNOSIS — R2689 Other abnormalities of gait and mobility: Secondary | ICD-10-CM | POA: Diagnosis not present
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
## 2022-07-26 ENCOUNTER — Ambulatory Visit: Payer: Medicare Other | Admitting: Physical Therapy

## 2022-07-26 DIAGNOSIS — Z9181 History of falling: Secondary | ICD-10-CM

## 2022-07-26 DIAGNOSIS — R262 Difficulty in walking, not elsewhere classified: Secondary | ICD-10-CM

## 2022-07-26 DIAGNOSIS — R2689 Other abnormalities of gait and mobility: Secondary | ICD-10-CM
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
## 2022-07-28 ENCOUNTER — Encounter: Payer: Self-pay | Admitting: Physical Therapy

## 2022-07-28 ENCOUNTER — Ambulatory Visit: Payer: Medicare Other | Admitting: Physical Therapy

## 2022-07-28 DIAGNOSIS — R2689 Other abnormalities of gait and mobility: Secondary | ICD-10-CM | POA: Diagnosis not present

## 2022-07-28 DIAGNOSIS — Z9181 History of falling: Secondary | ICD-10-CM

## 2022-07-28 DIAGNOSIS — R262 Difficulty in walking, not elsewhere classified: Secondary | ICD-10-CM
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
## 2022-08-02 ENCOUNTER — Encounter: Payer: Self-pay | Admitting: Physical Therapy

## 2022-08-02 ENCOUNTER — Ambulatory Visit: Payer: Medicare Other

## 2022-08-02 DIAGNOSIS — R2689 Other abnormalities of gait and mobility: Secondary | ICD-10-CM | POA: Diagnosis not present

## 2022-08-02 DIAGNOSIS — Z9181 History of falling: Secondary | ICD-10-CM

## 2022-08-02 DIAGNOSIS — R262 Difficulty in walking, not elsewhere classified: Secondary | ICD-10-CM
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
## 2022-08-04 ENCOUNTER — Encounter: Payer: Self-pay | Admitting: Physical Therapy

## 2022-08-04 ENCOUNTER — Ambulatory Visit: Payer: Medicare Other | Admitting: Physical Therapy

## 2022-08-04 DIAGNOSIS — Z9181 History of falling: Secondary | ICD-10-CM

## 2022-08-04 DIAGNOSIS — R2689 Other abnormalities of gait and mobility: Secondary | ICD-10-CM | POA: Diagnosis not present

## 2022-08-04 DIAGNOSIS — R262 Difficulty in walking, not elsewhere classified: Secondary | ICD-10-CM
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
## 2022-08-09 ENCOUNTER — Ambulatory Visit: Payer: Medicare Other | Admitting: Physical Therapy

## 2022-08-09 DIAGNOSIS — R262 Difficulty in walking, not elsewhere classified: Secondary | ICD-10-CM

## 2022-08-09 DIAGNOSIS — Z9181 History of falling: Secondary | ICD-10-CM

## 2022-08-09 DIAGNOSIS — R2689 Other abnormalities of gait and mobility: Secondary | ICD-10-CM | POA: Diagnosis not present
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
## 2022-08-10 ENCOUNTER — Encounter: Payer: Self-pay | Admitting: Physical Therapy

## 2022-08-11 ENCOUNTER — Ambulatory Visit: Payer: Medicare Other | Admitting: Physical Therapy

## 2022-08-11 DIAGNOSIS — R262 Difficulty in walking, not elsewhere classified: Secondary | ICD-10-CM

## 2022-08-11 DIAGNOSIS — Z9181 History of falling: Secondary | ICD-10-CM

## 2022-08-11 DIAGNOSIS — R2689 Other abnormalities of gait and mobility: Secondary | ICD-10-CM
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
## 2022-08-15 ENCOUNTER — Encounter: Payer: Self-pay | Admitting: Physical Therapy

## 2022-08-16 ENCOUNTER — Encounter: Payer: Self-pay | Admitting: Physical Therapy

## 2022-08-16 ENCOUNTER — Ambulatory Visit: Payer: Medicare Other | Attending: Internal Medicine | Admitting: Physical Therapy

## 2022-08-16 DIAGNOSIS — R262 Difficulty in walking, not elsewhere classified: Secondary | ICD-10-CM | POA: Insufficient documentation

## 2022-08-16 DIAGNOSIS — R2689 Other abnormalities of gait and mobility: Secondary | ICD-10-CM | POA: Insufficient documentation

## 2022-08-16 DIAGNOSIS — Z9181 History of falling: Secondary | ICD-10-CM | POA: Diagnosis present
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
## 2022-08-23 ENCOUNTER — Emergency Department
Admission: EM | Admit: 2022-08-23 | Discharge: 2022-08-24 | Disposition: A | Discharge status: Home / Self Care | Payer: Medicare Other | Attending: Emergency Medicine | Admitting: Emergency Medicine

## 2022-08-23 ENCOUNTER — Emergency Department: Payer: Medicare Other

## 2022-08-23 ENCOUNTER — Ambulatory Visit: Payer: Medicare Other | Admitting: Physical Therapy

## 2022-08-23 ENCOUNTER — Encounter: Payer: Self-pay | Admitting: Physical Therapy

## 2022-08-23 ENCOUNTER — Other Ambulatory Visit: Payer: Self-pay

## 2022-08-23 DIAGNOSIS — M5416 Radiculopathy, lumbar region: Secondary | ICD-10-CM | POA: Diagnosis not present

## 2022-08-23 DIAGNOSIS — S0990XA Unspecified injury of head, initial encounter: Secondary | ICD-10-CM | POA: Diagnosis not present

## 2022-08-23 DIAGNOSIS — Z9181 History of falling: Secondary | ICD-10-CM

## 2022-08-23 DIAGNOSIS — G629 Polyneuropathy, unspecified: Secondary | ICD-10-CM | POA: Insufficient documentation

## 2022-08-23 DIAGNOSIS — W19XXXA Unspecified fall, initial encounter: Secondary | ICD-10-CM

## 2022-08-23 DIAGNOSIS — R2689 Other abnormalities of gait and mobility: Secondary | ICD-10-CM | POA: Diagnosis not present

## 2022-08-23 DIAGNOSIS — G894 Chronic pain syndrome: Secondary | ICD-10-CM | POA: Insufficient documentation

## 2022-08-23 DIAGNOSIS — M79605 Pain in left leg: Secondary | ICD-10-CM | POA: Diagnosis present

## 2022-08-23 DIAGNOSIS — R262 Difficulty in walking, not elsewhere classified: Secondary | ICD-10-CM

## 2022-08-23 DIAGNOSIS — W010XXA Fall on same level from slipping, tripping and stumbling without subsequent striking against object, initial encounter: Secondary | ICD-10-CM | POA: Diagnosis not present
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
## 2022-08-23 NOTE — ED Triage Notes (Signed)
Pt presents to ER via ems from home with c/o fall at home while walking.  Pt c/o left leg pain, stating she tripped and fell on left hip.  Pt states pain is a numb/tingling pain in nature.  Pt has pedal pulses and sensation noted to left lower extremity.  Pt is otherwise A&O x4 and in NAD.    Pt denies LOC or taking blood thinners.

## 2022-08-24 ENCOUNTER — Emergency Department: Payer: Medicare Other

## 2022-08-24 DIAGNOSIS — M79605 Pain in left leg: Secondary | ICD-10-CM | POA: Diagnosis not present

## 2022-08-24 MED ORDER — GABAPENTIN 300 MG PO CAPS
300.0000 mg | ORAL_CAPSULE | ORAL | Status: AC
  Administered 2022-08-24: 300 mg via ORAL
  Filled 2022-08-24: qty 1

## 2022-08-24 MED ORDER — ACETAMINOPHEN 500 MG PO TABS
1000.0000 mg | ORAL_TABLET | Freq: Once | ORAL | Status: AC
  Administered 2022-08-24: 1000 mg via ORAL
  Filled 2022-08-24: qty 2

## 2022-08-24 NOTE — ED Provider Notes (Signed)
[facility] Provider Note    Event Date/Time   First MD Initiated Contact with Patient 08/23/22 2349     (approximate)   History   Leg Pain   HPI Early Catherine Avery is a 57 y.o. female who has a history of nerve damage from prior injuries including a cervical spine injury years ago.  She has chronic pain syndromes and chronic neuropathy particular on her left side.  She had a mechanical fall tonight where she tripped and fell at home, and landed on her left leg.  She said that she did not want to come in but she cannot move her left leg.  She said that it always feels like she has a "concrete block" on her leg, but it is worse tonight with severe pain up in her hip that is keeping her from being able to move her leg much at all.  She also reports having urinary retention which is abnormal for her, and then once she was able to "get it to break loose" she has been urinating a lot.  She does not think she hit her head but cannot be certain.  She does not believe she passed out.  She has no neck pain or middle back pain but she says she has some chronic lower back pain.  She says that she can feel when I touch her leg and all the way down to her foot but she feels like she cannot lift it, not only because of the pain but because it is not working the way it usually does.     Physical Exam   Triage Vital Signs: ED Triage Vitals  Enc Vitals Group     BP 08/23/22 2135 (!) 133/116     Pulse Rate 08/23/22 2135 60     Resp 08/23/22 2135 18     Temp 08/23/22 2135 99.3 F (37.4 C)     Temp Source 08/23/22 2135 Oral     SpO2 08/23/22 2135 94 %     Weight 08/23/22 2131 77.1 kg (170 lb)     Height 08/23/22 2131 1.626 m (5\' 4" )     Head Circumference --      Peak Flow --      Pain Score 08/23/22 2131 10     Pain Loc --      Pain Edu? --      Excl. in GC? --     Most recent vital signs: Vitals:   08/23/22 2135  BP: (!) 133/116  Pulse: 60  Resp: 18  Temp: 99.3 F  (37.4 C)  SpO2: 94%    General: Awake, no distress, but appears uncomfortable. CV:  Good peripheral perfusion.  Regular rate and rhythm.  Palpable distal pulse on left foot. Resp:  Normal effort. Speaking easily and comfortably, no accessory muscle usage nor intercostal retractions.   Abd:  No distention.  Other:  Tenderness to palpation/manipulation of the left lower leg.  She is able to move it a little bit with some flexion and extension at the knee and the ankle, but is unable or unwilling to move her leg substantially and says that she feels like she cannot do so and it hurts to do so.  There are no obvious gross deformities.  She has at least a degree of peripheral edema but it is equal bilaterally.  No palpable deformities nor tenderness to palpation of her neck or throughout her spine.  Pelvis feels stable.   ED Results /  Procedures / Treatments   Labs (all labs ordered are listed, but only abnormal results are displayed) Labs Reviewed - No data to display     RADIOLOGY I viewed and interpreted the patient's CT head and CT C-spine which were ordered in triage.  No evidence of acute intracranial abnormality nor C-spine abnormality.  I also viewed and interpreted the patient's left hip and pelvis x-rays.  I see no evidence of fracture or dislocation.  I also viewed and interpreted the patient's lumbar spine and left hip CT scans.  See hospital course for details, but no evidence of acute injury.   PROCEDURES:  Critical Care performed: No  Procedures    IMPRESSION / MDM / ASSESSMENT AND PLAN / ED COURSE  I reviewed the triage vital signs and the nursing notes.                              Differential diagnosis includes, but is not limited to, fracture, dislocation, cauda equina syndrome, lumbar/peripheral neuropathy, spinal fracture, protruding disc.  Patient's presentation is most consistent with acute presentation with potential threat to life or bodily  function.  Labs/studies ordered: C-spine CT, head CT, hip and pelvis x-rays (left), left hip CT, lumbar spine CT  Interventions/Medications given:  Medications  acetaminophen (TYLENOL) tablet 1,000 mg (1,000 mg Oral Given 08/24/22 0059)  gabapentin (NEURONTIN) capsule 300 mg (300 mg Oral Given 08/24/22 0059)    (Note:  hospital course my include additional interventions and/or labs/studies not listed above.)   Patient's initial imaging from triage (head and C-spine CTs, left hip and pelvis x-rays) are normal with no evidence of traumatic injury.  However the patient is reporting symptoms concerning for cauda equina syndrome, specifically the worsening of chronic deficits in the left leg and an initial inability to urinate followed by a higher volume of urination.  Unfortunately the patient has a pacemaker and I cannot obtain an MRI tonight.  We talked about this issue and how much of her inability to move her leg may be chronic and just exacerbated by her fall.  We decided upon CT scans of the lumbar spine and the left hip to evaluate for occult fractures or any obvious gross deformity that may point to a more emergent neurosurgical issue and I will reassess once the imaging is complete.  I ordered a dose of gabapentin and Tylenol in the meantime.   Clinical Course as of 08/24/22 0353  Wed Aug 24, 2022  0350 I viewed and interpreted the patient's left hip CT and lumbar spine CT and I see no evidence of fracture dislocation or other acute injury.  Radiology report agrees.  I reassessed the patient and she says she feels much better.  She is moving her leg, wiggling her toes, and states "it feels like it woke up".  She may have had a brief transient nerve impingement as a result of the fall and the way she was laying on the floor but she feels much better and says she is ready to go.  Her sister is ready to take her home.  She has been urinating and there is no indication she requires emergent  neurosurgical evaluation/investigation in the absence of being able to get an MRI.  I encouraged follow-up with Dr. Marcello Fennel and gave my usual return precautions. [CF]    Clinical Course User Index [CF] Loleta Rose, MD     FINAL CLINICAL IMPRESSION(S) / ED DIAGNOSES  Final diagnoses:  Fall, initial encounter  Left leg pain  Lumbar radiculopathy     Rx / DC Orders   ED Discharge Orders     None        Note:  This document was prepared using Dragon voice recognition software and may include unintentional dictation errors.   Loleta Rose, MD 08/24/22 [phone]

## 2022-08-24 NOTE — Discharge Instructions (Signed)

## 2022-08-29 ENCOUNTER — Ambulatory Visit: Payer: Medicare Other | Admitting: Physical Therapy

## 2022-08-29 DIAGNOSIS — R262 Difficulty in walking, not elsewhere classified: Secondary | ICD-10-CM

## 2022-08-29 DIAGNOSIS — Z9181 History of falling: Secondary | ICD-10-CM

## 2022-08-29 DIAGNOSIS — R2689 Other abnormalities of gait and mobility: Secondary | ICD-10-CM | POA: Diagnosis not present
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
## 2022-08-30 ENCOUNTER — Encounter: Payer: Self-pay | Admitting: Physical Therapy

## 2022-08-31 ENCOUNTER — Ambulatory Visit: Payer: Medicare Other | Admitting: Physical Therapy

## 2022-08-31 DIAGNOSIS — Z9181 History of falling: Secondary | ICD-10-CM

## 2022-08-31 DIAGNOSIS — R2689 Other abnormalities of gait and mobility: Secondary | ICD-10-CM

## 2022-08-31 DIAGNOSIS — R262 Difficulty in walking, not elsewhere classified: Secondary | ICD-10-CM
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
## 2022-09-06 ENCOUNTER — Ambulatory Visit: Payer: Medicare Other | Admitting: Physical Therapy

## 2022-09-06 DIAGNOSIS — R262 Difficulty in walking, not elsewhere classified: Secondary | ICD-10-CM

## 2022-09-06 DIAGNOSIS — R2689 Other abnormalities of gait and mobility: Secondary | ICD-10-CM | POA: Diagnosis not present

## 2022-09-06 DIAGNOSIS — Z9181 History of falling: Secondary | ICD-10-CM
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
## 2022-09-08 ENCOUNTER — Encounter: Payer: Self-pay | Admitting: Physical Therapy

## 2022-09-08 ENCOUNTER — Ambulatory Visit: Payer: Medicare Other | Admitting: Physical Therapy

## 2022-09-08 DIAGNOSIS — R262 Difficulty in walking, not elsewhere classified: Secondary | ICD-10-CM

## 2022-09-08 DIAGNOSIS — Z9181 History of falling: Secondary | ICD-10-CM

## 2022-09-08 DIAGNOSIS — R2689 Other abnormalities of gait and mobility: Secondary | ICD-10-CM
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
## 2022-09-13 ENCOUNTER — Ambulatory Visit: Payer: Medicare Other | Admitting: Physical Therapy

## 2022-09-13 DIAGNOSIS — R2689 Other abnormalities of gait and mobility: Secondary | ICD-10-CM | POA: Diagnosis not present

## 2022-09-13 DIAGNOSIS — Z9181 History of falling: Secondary | ICD-10-CM

## 2022-09-13 DIAGNOSIS — R262 Difficulty in walking, not elsewhere classified: Secondary | ICD-10-CM
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
## 2022-09-14 ENCOUNTER — Encounter: Payer: Self-pay | Admitting: Physical Therapy

## 2022-09-15 ENCOUNTER — Ambulatory Visit: Payer: Medicare Other | Attending: Internal Medicine | Admitting: Physical Therapy

## 2022-09-15 DIAGNOSIS — R262 Difficulty in walking, not elsewhere classified: Secondary | ICD-10-CM | POA: Diagnosis present

## 2022-09-15 DIAGNOSIS — R2689 Other abnormalities of gait and mobility: Secondary | ICD-10-CM | POA: Insufficient documentation

## 2022-09-15 DIAGNOSIS — Z9181 History of falling: Secondary | ICD-10-CM | POA: Diagnosis present
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
## 2022-09-18 ENCOUNTER — Encounter: Payer: Self-pay | Admitting: Physical Therapy

## 2022-09-20 ENCOUNTER — Ambulatory Visit: Payer: Medicare Other | Admitting: Physical Therapy

## 2022-09-20 DIAGNOSIS — Z9181 History of falling: Secondary | ICD-10-CM

## 2022-09-20 DIAGNOSIS — R2689 Other abnormalities of gait and mobility: Secondary | ICD-10-CM | POA: Diagnosis not present

## 2022-09-20 DIAGNOSIS — R262 Difficulty in walking, not elsewhere classified: Secondary | ICD-10-CM
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
## 2022-09-22 ENCOUNTER — Ambulatory Visit: Payer: Medicare Other | Admitting: Physical Therapy

## 2022-09-22 ENCOUNTER — Encounter: Payer: Self-pay | Admitting: Physical Therapy

## 2022-09-22 DIAGNOSIS — Z9181 History of falling: Secondary | ICD-10-CM

## 2022-09-22 DIAGNOSIS — R2689 Other abnormalities of gait and mobility: Secondary | ICD-10-CM | POA: Diagnosis not present

## 2022-09-22 DIAGNOSIS — R262 Difficulty in walking, not elsewhere classified: Secondary | ICD-10-CM
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
## 2022-09-27 ENCOUNTER — Encounter: Payer: Self-pay | Admitting: Physical Therapy

## 2022-09-27 ENCOUNTER — Ambulatory Visit: Payer: Medicare Other | Admitting: Physical Therapy

## 2022-09-27 DIAGNOSIS — R262 Difficulty in walking, not elsewhere classified: Secondary | ICD-10-CM

## 2022-09-27 DIAGNOSIS — R2689 Other abnormalities of gait and mobility: Secondary | ICD-10-CM

## 2022-09-27 DIAGNOSIS — Z9181 History of falling: Secondary | ICD-10-CM
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
## 2022-09-29 ENCOUNTER — Encounter: Payer: Self-pay | Admitting: Physical Therapy

## 2022-09-29 ENCOUNTER — Ambulatory Visit: Payer: Medicare Other | Admitting: Physical Therapy

## 2022-09-29 DIAGNOSIS — Z9181 History of falling: Secondary | ICD-10-CM

## 2022-09-29 DIAGNOSIS — R2689 Other abnormalities of gait and mobility: Secondary | ICD-10-CM | POA: Diagnosis not present

## 2022-09-29 DIAGNOSIS — R262 Difficulty in walking, not elsewhere classified: Secondary | ICD-10-CM
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
## 2022-10-03 ENCOUNTER — Encounter: Payer: Self-pay | Admitting: Physical Therapy

## 2022-10-04 ENCOUNTER — Ambulatory Visit: Payer: Medicare Other | Admitting: Physical Therapy

## 2022-10-04 ENCOUNTER — Encounter: Payer: Self-pay | Admitting: Physical Therapy

## 2022-10-04 DIAGNOSIS — R2689 Other abnormalities of gait and mobility: Secondary | ICD-10-CM

## 2022-10-04 DIAGNOSIS — Z9181 History of falling: Secondary | ICD-10-CM

## 2022-10-04 DIAGNOSIS — R262 Difficulty in walking, not elsewhere classified: Secondary | ICD-10-CM
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
## 2022-10-06 ENCOUNTER — Ambulatory Visit: Payer: Medicare Other | Admitting: Physical Therapy

## 2022-10-06 DIAGNOSIS — R2689 Other abnormalities of gait and mobility: Secondary | ICD-10-CM | POA: Diagnosis not present

## 2022-10-06 DIAGNOSIS — Z9181 History of falling: Secondary | ICD-10-CM

## 2022-10-06 DIAGNOSIS — R262 Difficulty in walking, not elsewhere classified: Secondary | ICD-10-CM
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
## 2022-10-11 ENCOUNTER — Ambulatory Visit: Payer: Medicare Other | Admitting: Physical Therapy

## 2022-10-11 ENCOUNTER — Encounter: Payer: Self-pay | Admitting: Physical Therapy

## 2022-10-11 DIAGNOSIS — R262 Difficulty in walking, not elsewhere classified: Secondary | ICD-10-CM

## 2022-10-11 DIAGNOSIS — R2689 Other abnormalities of gait and mobility: Secondary | ICD-10-CM

## 2022-10-11 DIAGNOSIS — Z9181 History of falling: Secondary | ICD-10-CM
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
## 2022-10-13 ENCOUNTER — Ambulatory Visit: Payer: Medicare Other | Admitting: Physical Therapy

## 2022-10-13 DIAGNOSIS — R2689 Other abnormalities of gait and mobility: Secondary | ICD-10-CM | POA: Diagnosis not present

## 2022-10-13 DIAGNOSIS — Z9181 History of falling: Secondary | ICD-10-CM

## 2022-10-13 DIAGNOSIS — R262 Difficulty in walking, not elsewhere classified: Secondary | ICD-10-CM
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
## 2022-10-18 ENCOUNTER — Ambulatory Visit: Payer: Medicare Other | Attending: Internal Medicine | Admitting: Physical Therapy

## 2022-10-18 DIAGNOSIS — R262 Difficulty in walking, not elsewhere classified: Secondary | ICD-10-CM | POA: Insufficient documentation

## 2022-10-18 DIAGNOSIS — Z9181 History of falling: Secondary | ICD-10-CM | POA: Insufficient documentation

## 2022-10-18 DIAGNOSIS — R2689 Other abnormalities of gait and mobility: Secondary | ICD-10-CM | POA: Diagnosis present
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
## 2022-10-19 ENCOUNTER — Encounter: Payer: Self-pay | Admitting: Physical Therapy

## 2022-10-20 ENCOUNTER — Ambulatory Visit: Payer: Medicare Other | Admitting: Physical Therapy

## 2022-10-20 ENCOUNTER — Encounter: Payer: Self-pay | Admitting: Physical Therapy

## 2022-10-20 DIAGNOSIS — Z9181 History of falling: Secondary | ICD-10-CM

## 2022-10-20 DIAGNOSIS — R262 Difficulty in walking, not elsewhere classified: Secondary | ICD-10-CM

## 2022-10-20 DIAGNOSIS — R2689 Other abnormalities of gait and mobility: Secondary | ICD-10-CM
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
## 2022-10-24 ENCOUNTER — Encounter: Payer: Self-pay | Admitting: Physical Therapy

## 2022-10-24 ENCOUNTER — Ambulatory Visit: Payer: Medicare Other | Admitting: Physical Therapy

## 2022-10-24 DIAGNOSIS — R262 Difficulty in walking, not elsewhere classified: Secondary | ICD-10-CM

## 2022-10-24 DIAGNOSIS — Z9181 History of falling: Secondary | ICD-10-CM

## 2022-10-24 DIAGNOSIS — R2689 Other abnormalities of gait and mobility: Secondary | ICD-10-CM | POA: Diagnosis not present
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
## 2022-10-26 ENCOUNTER — Ambulatory Visit: Payer: Medicare Other | Admitting: Physical Therapy

## 2022-10-26 ENCOUNTER — Encounter: Payer: Self-pay | Admitting: Physical Therapy

## 2022-10-26 DIAGNOSIS — R262 Difficulty in walking, not elsewhere classified: Secondary | ICD-10-CM

## 2022-10-26 DIAGNOSIS — Z9181 History of falling: Secondary | ICD-10-CM

## 2022-10-26 DIAGNOSIS — R2689 Other abnormalities of gait and mobility: Secondary | ICD-10-CM | POA: Diagnosis not present
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
## 2022-11-01 ENCOUNTER — Ambulatory Visit: Payer: Medicare Other | Admitting: Physical Therapy

## 2022-11-01 ENCOUNTER — Encounter: Payer: Self-pay | Admitting: Physical Therapy

## 2022-11-01 DIAGNOSIS — R2689 Other abnormalities of gait and mobility: Secondary | ICD-10-CM | POA: Diagnosis not present

## 2022-11-01 DIAGNOSIS — R262 Difficulty in walking, not elsewhere classified: Secondary | ICD-10-CM

## 2022-11-01 DIAGNOSIS — Z9181 History of falling: Secondary | ICD-10-CM
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
## 2022-11-03 ENCOUNTER — Ambulatory Visit: Payer: Medicare Other | Admitting: Physical Therapy

## 2022-11-03 DIAGNOSIS — Z9181 History of falling: Secondary | ICD-10-CM

## 2022-11-03 DIAGNOSIS — R2689 Other abnormalities of gait and mobility: Secondary | ICD-10-CM

## 2022-11-03 DIAGNOSIS — R262 Difficulty in walking, not elsewhere classified: Secondary | ICD-10-CM
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
## 2022-11-07 ENCOUNTER — Encounter: Payer: Self-pay | Admitting: Physical Therapy

## 2022-11-08 ENCOUNTER — Ambulatory Visit: Payer: Medicare Other | Admitting: Physical Therapy

## 2022-11-08 ENCOUNTER — Encounter: Payer: Self-pay | Admitting: Physical Therapy

## 2022-11-08 DIAGNOSIS — R262 Difficulty in walking, not elsewhere classified: Secondary | ICD-10-CM

## 2022-11-08 DIAGNOSIS — Z9181 History of falling: Secondary | ICD-10-CM

## 2022-11-08 DIAGNOSIS — R2689 Other abnormalities of gait and mobility: Secondary | ICD-10-CM
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
## 2022-11-10 ENCOUNTER — Ambulatory Visit: Payer: Medicare Other | Admitting: Physical Therapy

## 2022-11-10 DIAGNOSIS — R262 Difficulty in walking, not elsewhere classified: Secondary | ICD-10-CM

## 2022-11-10 DIAGNOSIS — Z9181 History of falling: Secondary | ICD-10-CM

## 2022-11-10 DIAGNOSIS — R2689 Other abnormalities of gait and mobility: Secondary | ICD-10-CM

## 2022-11-14 ENCOUNTER — Encounter: Payer: Self-pay | Admitting: Physical Therapy

## 2022-11-14 ENCOUNTER — Ambulatory Visit: Payer: Medicare Other | Admitting: Physical Therapy

## 2022-11-14 DIAGNOSIS — Z9181 History of falling: Secondary | ICD-10-CM

## 2022-11-14 DIAGNOSIS — R2689 Other abnormalities of gait and mobility: Secondary | ICD-10-CM | POA: Diagnosis not present

## 2022-11-14 DIAGNOSIS — R262 Difficulty in walking, not elsewhere classified: Secondary | ICD-10-CM
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
## 2022-11-16 ENCOUNTER — Ambulatory Visit: Payer: Medicare Other | Attending: Internal Medicine | Admitting: Physical Therapy

## 2022-11-16 DIAGNOSIS — R262 Difficulty in walking, not elsewhere classified: Secondary | ICD-10-CM | POA: Insufficient documentation

## 2022-11-16 DIAGNOSIS — Z9181 History of falling: Secondary | ICD-10-CM | POA: Diagnosis present

## 2022-11-16 DIAGNOSIS — R2689 Other abnormalities of gait and mobility: Secondary | ICD-10-CM | POA: Insufficient documentation
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
## 2022-11-21 ENCOUNTER — Encounter: Payer: Self-pay | Admitting: Physical Therapy

## 2022-11-21 ENCOUNTER — Ambulatory Visit: Payer: Medicare Other | Admitting: Physical Therapy

## 2022-11-21 DIAGNOSIS — Z9181 History of falling: Secondary | ICD-10-CM

## 2022-11-21 DIAGNOSIS — R2689 Other abnormalities of gait and mobility: Secondary | ICD-10-CM | POA: Diagnosis not present

## 2022-11-21 DIAGNOSIS — R262 Difficulty in walking, not elsewhere classified: Secondary | ICD-10-CM
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
## 2022-11-23 ENCOUNTER — Ambulatory Visit: Payer: Medicare Other | Admitting: Physical Therapy

## 2022-11-23 ENCOUNTER — Encounter: Payer: Self-pay | Admitting: Physical Therapy

## 2022-11-23 DIAGNOSIS — R262 Difficulty in walking, not elsewhere classified: Secondary | ICD-10-CM

## 2022-11-23 DIAGNOSIS — R2689 Other abnormalities of gait and mobility: Secondary | ICD-10-CM

## 2022-11-23 DIAGNOSIS — Z9181 History of falling: Secondary | ICD-10-CM
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
## 2022-11-29 ENCOUNTER — Ambulatory Visit: Payer: Medicare Other | Admitting: Physical Therapy

## 2022-11-29 DIAGNOSIS — R262 Difficulty in walking, not elsewhere classified: Secondary | ICD-10-CM

## 2022-11-29 DIAGNOSIS — R2689 Other abnormalities of gait and mobility: Secondary | ICD-10-CM

## 2022-11-29 DIAGNOSIS — Z9181 History of falling: Secondary | ICD-10-CM
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
## 2022-12-01 ENCOUNTER — Encounter: Payer: Self-pay | Admitting: Physical Therapy

## 2022-12-01 ENCOUNTER — Ambulatory Visit: Payer: Medicare Other | Admitting: Physical Therapy

## 2022-12-01 DIAGNOSIS — R262 Difficulty in walking, not elsewhere classified: Secondary | ICD-10-CM

## 2022-12-01 DIAGNOSIS — R2689 Other abnormalities of gait and mobility: Secondary | ICD-10-CM

## 2022-12-01 DIAGNOSIS — Z9181 History of falling: Secondary | ICD-10-CM
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
## 2022-12-06 ENCOUNTER — Ambulatory Visit: Payer: Medicare Other | Admitting: Physical Therapy

## 2022-12-06 ENCOUNTER — Encounter: Payer: Self-pay | Admitting: Physical Therapy

## 2022-12-06 DIAGNOSIS — R2689 Other abnormalities of gait and mobility: Secondary | ICD-10-CM

## 2022-12-06 DIAGNOSIS — R262 Difficulty in walking, not elsewhere classified: Secondary | ICD-10-CM

## 2022-12-06 DIAGNOSIS — Z9181 History of falling: Secondary | ICD-10-CM
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
## 2022-12-08 ENCOUNTER — Ambulatory Visit: Payer: Medicare Other | Admitting: Physical Therapy

## 2022-12-08 DIAGNOSIS — R262 Difficulty in walking, not elsewhere classified: Secondary | ICD-10-CM

## 2022-12-08 DIAGNOSIS — R2689 Other abnormalities of gait and mobility: Secondary | ICD-10-CM

## 2022-12-08 DIAGNOSIS — Z9181 History of falling: Secondary | ICD-10-CM
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
## 2022-12-13 ENCOUNTER — Encounter: Payer: Medicare Other | Admitting: Physical Therapy

## 2022-12-15 ENCOUNTER — Encounter: Payer: Medicare Other | Admitting: Physical Therapy

## 2023-01-10 ENCOUNTER — Encounter: Payer: Self-pay | Admitting: *Deleted

## 2023-01-21 IMAGING — CT CT HEAD CODE STROKE
4 series · 16 of 47 positions shown, 18 images · non-contrast
Comparison: CT head December 27, 19.

CLINICAL DATA: Code stroke.  Neuro deficit, acute, stroke suspected



[Series 3: head bone · axial · 0.43mm/px · z∈[-141,-111]mm · 3 of 75 slices shown]
[im 8/75  bone]
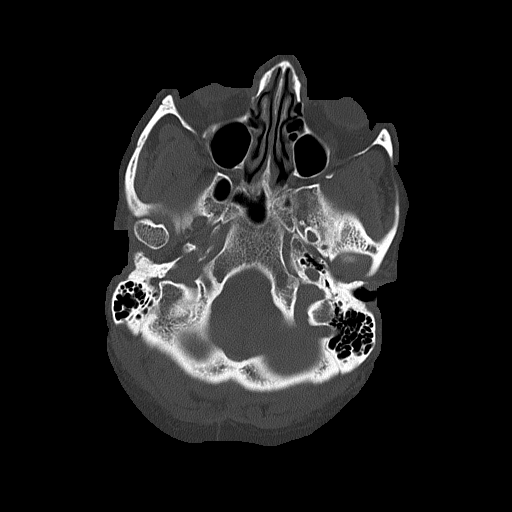
[im 15/75  bone]
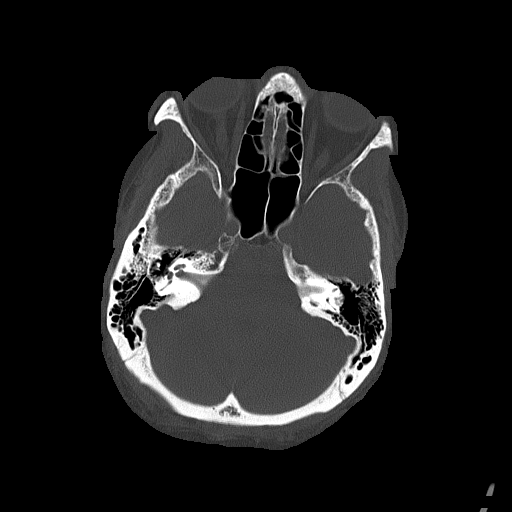
[im 23/75  bone]
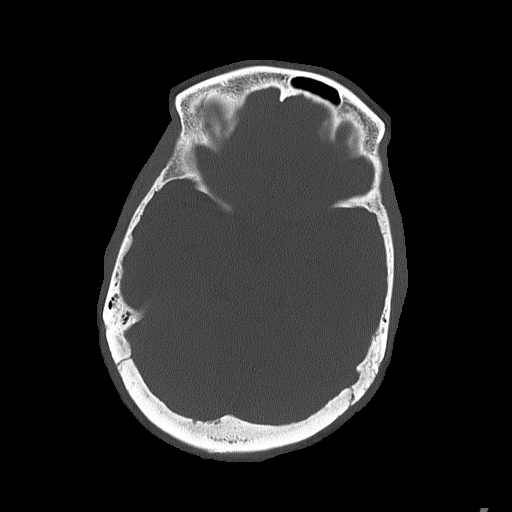

[Series 4: head wo · axial · 0.43mm/px · z∈[-140,-30]mm · 7 of 30 slices shown, 9 images]
[im 4/30  brain]
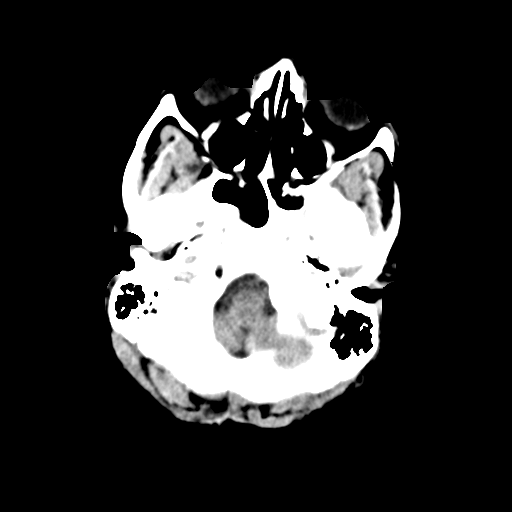
[im 4/30  bone]
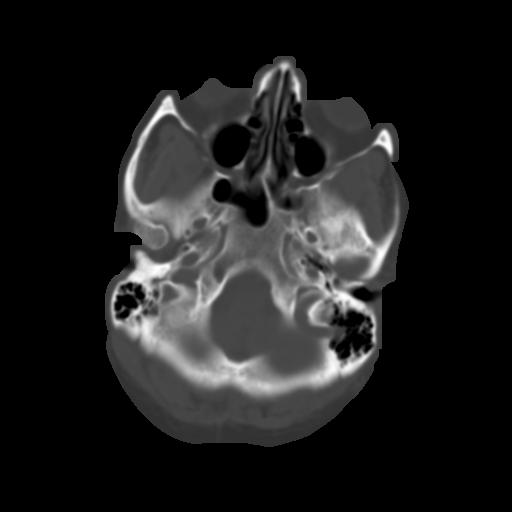
[im 8/30  brain]
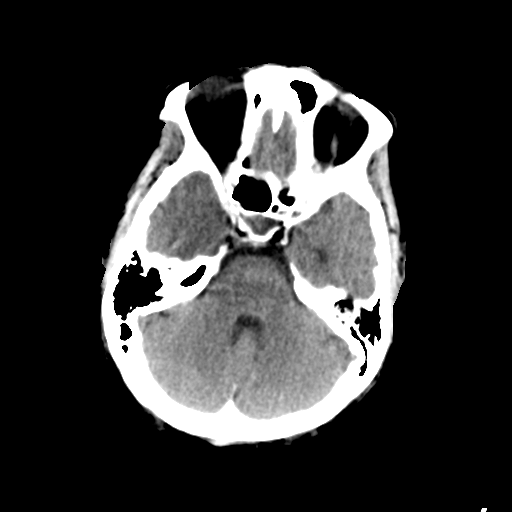
[im 11/30  brain]
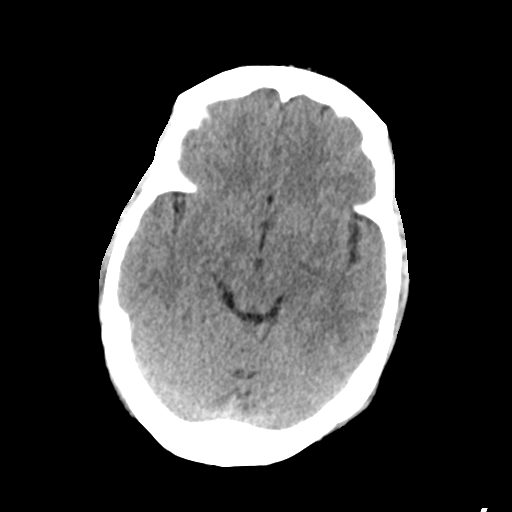
[im 15/30  brain]
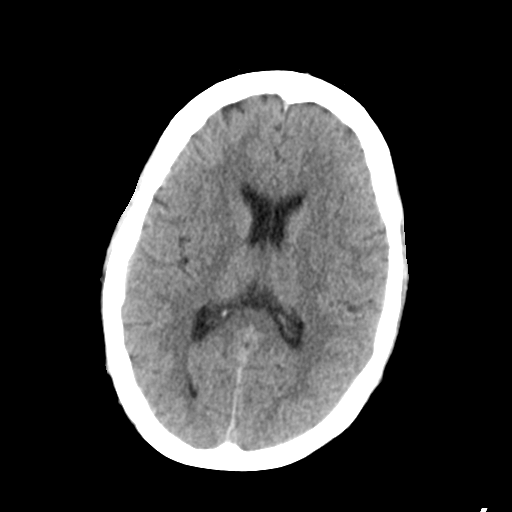
[im 19/30  brain]
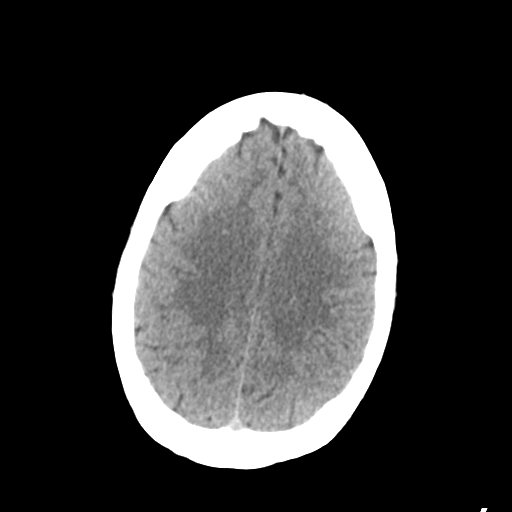
[im 19/30  bone]
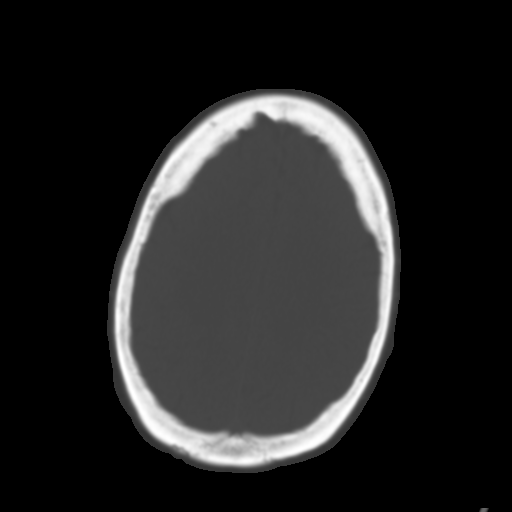
[im 22/30  brain]
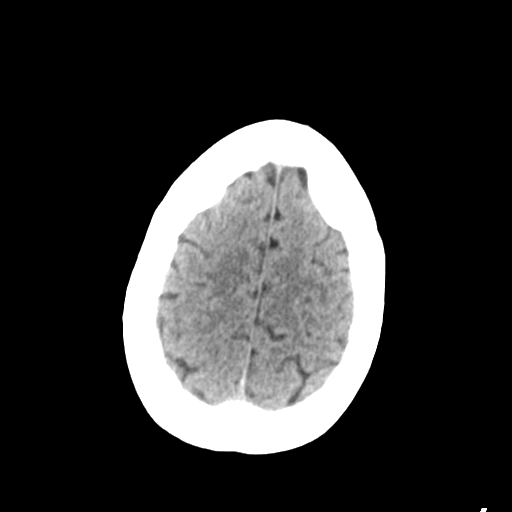
[im 26/30  brain]
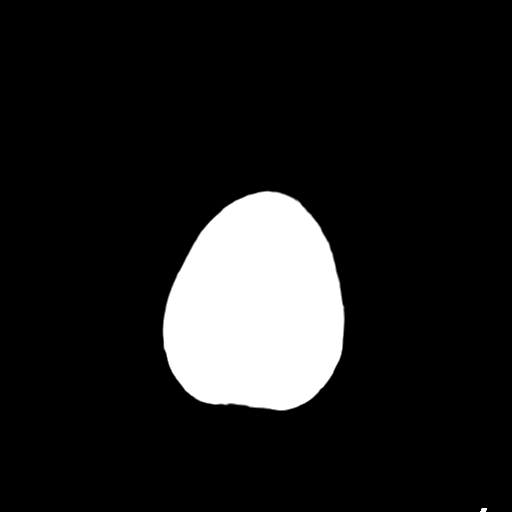

[Series 5: coronal soft tissue · coronal · 0.31mm/px · 3 of 63 slices shown]
[im 21/63  brain]
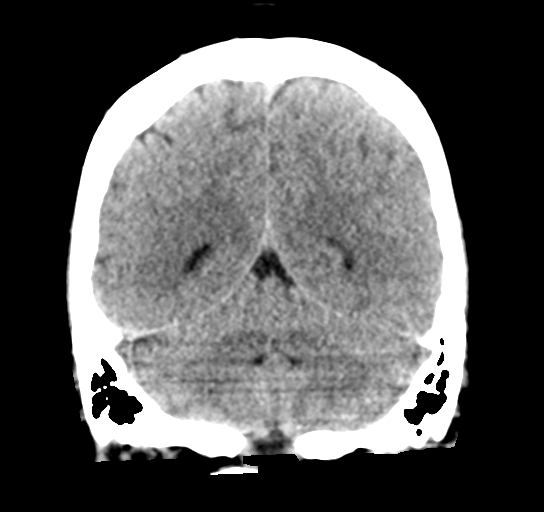
[im 28/63  brain]
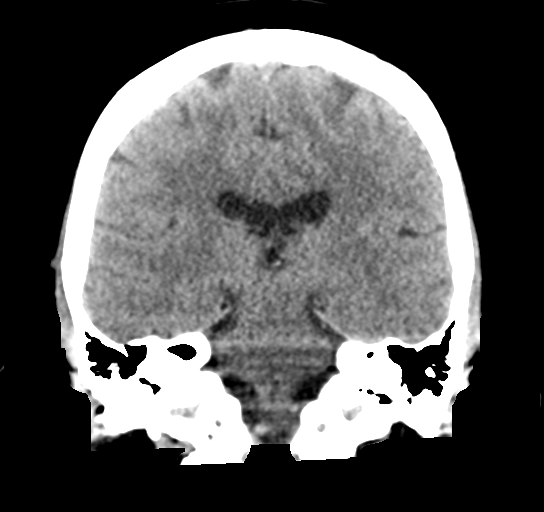
[im 35/63  brain]
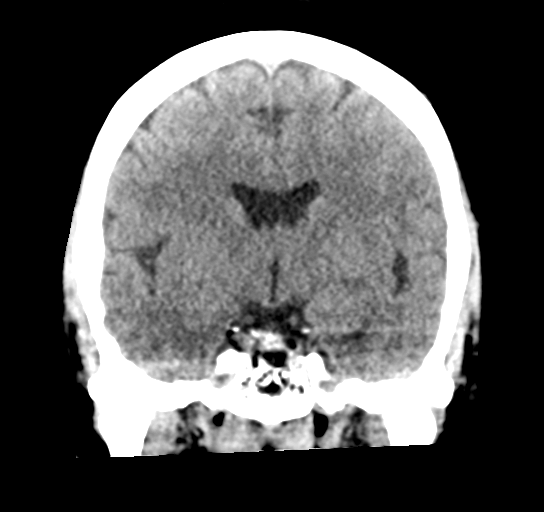

[Series 6: sagittal soft tissue · sagittal · 0.32mm/px · 3 of 49 slices shown]
[im 17/49  brain]
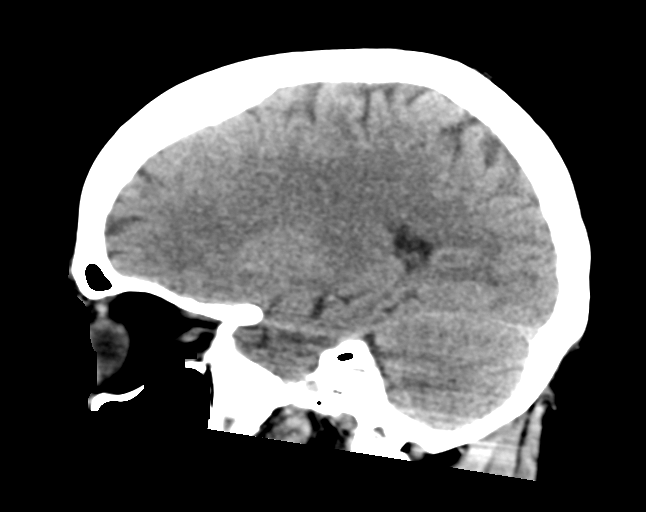
[im 25/49  brain]
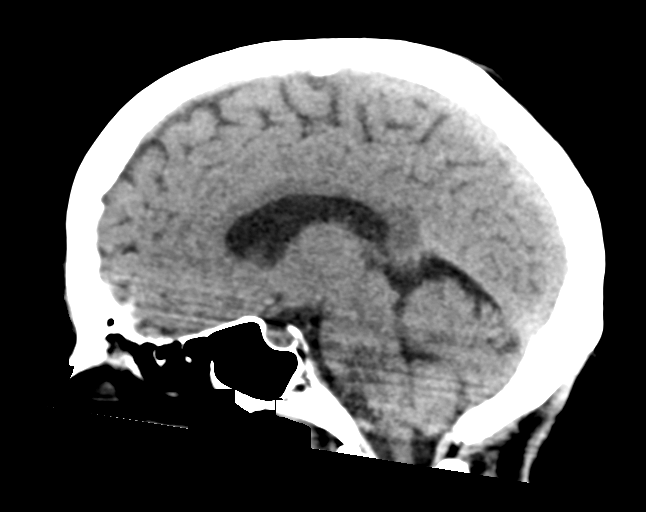
[im 33/49  brain]
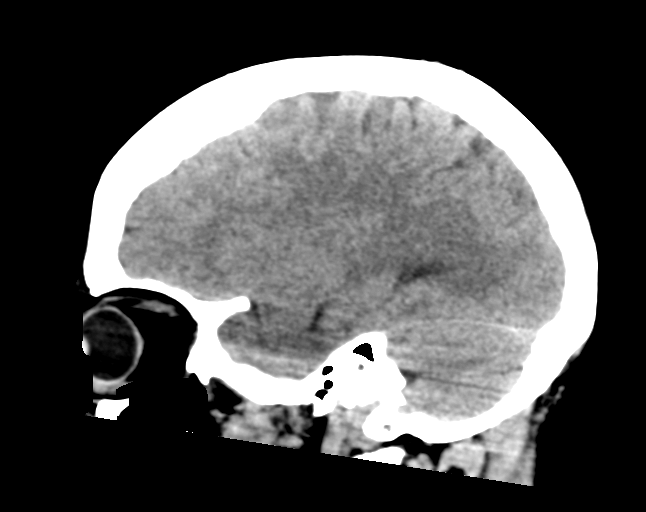

[16 of 47 positions shown; findings below may reference images not displayed]

FINDINGS: Brain: No evidence of acute large vascular territory infarction,
hemorrhage, hydrocephalus, extra-axial collection or mass
lesion/mass effect.

Vascular: No hyperdense vessel identified.

Skull: No acute fracture.

Sinuses/Orbits: Clear visualized sinuses. No acute orbital findings.

Other: No mastoid effusions.

ASPECTS (Alberta Stroke Program Early CT Score) total score (0-10
with 10 being normal): 10.
IMPRESSION: No evidence of acute intracranial abnormality.  ASPECTS is 10.

Code stroke imaging results were communicated on 07/16/2021 at [DATE]
to provider Dr. Caitie via secure text paging.

## 2023-02-10 ENCOUNTER — Encounter: Payer: Self-pay | Admitting: *Deleted

## 2023-02-13 ENCOUNTER — Ambulatory Visit: Payer: Medicare Other | Admitting: Registered Nurse

## 2023-02-13 ENCOUNTER — Other Ambulatory Visit: Payer: Self-pay

## 2023-02-13 ENCOUNTER — Ambulatory Visit
Admission: RE | Admit: 2023-02-13 | Discharge: 2023-02-13 | Disposition: A | Discharge status: Home / Self Care | Payer: Medicare Other | Attending: Gastroenterology | Admitting: Gastroenterology

## 2023-02-13 ENCOUNTER — Encounter
Admission: RE | Disposition: A | Discharge status: Home / Self Care | Payer: Self-pay | Source: Home / Self Care | Attending: Gastroenterology

## 2023-02-13 ENCOUNTER — Encounter: Payer: Self-pay | Admitting: *Deleted

## 2023-02-13 DIAGNOSIS — Z1211 Encounter for screening for malignant neoplasm of colon: Secondary | ICD-10-CM | POA: Insufficient documentation

## 2023-02-13 DIAGNOSIS — K620 Anal polyp: Secondary | ICD-10-CM | POA: Diagnosis not present

## 2023-02-13 DIAGNOSIS — E1140 Type 2 diabetes mellitus with diabetic neuropathy, unspecified: Secondary | ICD-10-CM | POA: Insufficient documentation

## 2023-02-13 DIAGNOSIS — Z950 Presence of cardiac pacemaker: Secondary | ICD-10-CM | POA: Diagnosis not present

## 2023-02-13 DIAGNOSIS — Z8674 Personal history of sudden cardiac arrest: Secondary | ICD-10-CM | POA: Insufficient documentation

## 2023-02-13 DIAGNOSIS — K5730 Diverticulosis of large intestine without perforation or abscess without bleeding: Secondary | ICD-10-CM | POA: Diagnosis not present

## 2023-02-13 DIAGNOSIS — K640 First degree hemorrhoids: Secondary | ICD-10-CM | POA: Diagnosis not present

## 2023-02-13 DIAGNOSIS — Z800 Family history of malignant neoplasm of digestive organs: Secondary | ICD-10-CM | POA: Diagnosis not present

## 2023-02-13 HISTORY — DX: Carpal tunnel syndrome, left upper limb: G56.02

## 2023-02-13 HISTORY — DX: Unspecified osteoarthritis, unspecified site: M19.90

## 2023-02-13 HISTORY — DX: Personal history of transient ischemic attack (TIA), and cerebral infarction without residual deficits: Z86.73

## 2023-02-13 HISTORY — DX: Other vascular myelopathies: G95.19

## 2023-02-13 HISTORY — PX: BIOPSY: SHX5522

## 2023-02-13 HISTORY — DX: Pneumonia, unspecified organism: J18.9

## 2023-02-13 HISTORY — DX: Personal history of sudden cardiac arrest: Z86.74

## 2023-02-13 HISTORY — DX: Hypomagnesemia: E83.42

## 2023-02-13 HISTORY — DX: Unspecified displaced fracture of second cervical vertebra, initial encounter for closed fracture: S12.100A

## 2023-02-13 HISTORY — DX: Presence of cardiac pacemaker: Z95.0

## 2023-02-13 HISTORY — PX: COLONOSCOPY WITH PROPOFOL: SHX5780

## 2023-02-13 HISTORY — DX: Unspecified mononeuropathy of bilateral lower limbs: G57.93

## 2023-02-13 HISTORY — DX: Other specified abnormal findings of blood chemistry: R79.89

## 2023-02-13 SURGERY — COLONOSCOPY WITH PROPOFOL
Anesthesia: General

## 2023-02-13 MED ORDER — LIDOCAINE HCL (PF) 2 % IJ SOLN
INTRAMUSCULAR | Status: DC | PRN
  Administered 2023-02-13: 40 mg via INTRADERMAL

## 2023-02-13 MED ORDER — LIDOCAINE HCL (PF) 2 % IJ SOLN
INTRAMUSCULAR | Status: AC
  Filled 2023-02-13: qty 5

## 2023-02-13 MED ORDER — SODIUM CHLORIDE 0.9 % IV SOLN: INTRAVENOUS | Status: DC

## 2023-02-13 MED ORDER — PROPOFOL 10 MG/ML IV BOLUS
INTRAVENOUS | Status: DC | PRN
  Administered 2023-02-13: 50 mg via INTRAVENOUS

## 2023-02-13 MED ORDER — PROPOFOL 500 MG/50ML IV EMUL
INTRAVENOUS | Status: DC | PRN
  Administered 2023-02-13: 100 ug/kg/min via INTRAVENOUS

## 2023-02-13 MED ORDER — PROPOFOL 1000 MG/100ML IV EMUL
INTRAVENOUS | Status: AC
  Filled 2023-02-13: qty 100

## 2023-02-13 MED ORDER — PROPOFOL 10 MG/ML IV BOLUS
INTRAVENOUS | Status: AC
  Filled 2023-02-13: qty 20

## 2023-02-13 NOTE — Op Note (Signed)
[facility] Patient Name: Catherine Avery Procedure Date: 02/13/2023 7:53 AM MRN: [id number] Account #: [id number] Date of Birth: [birth year] Admit Type: Outpatient Age: 57 Room: [facility] ENDO ROOM 3 Gender: Female Note Status: Finalized Instrument Name: Prentice Docker [id number] Procedure:             Colonoscopy Indications:           Colon cancer screening in patient at increased risk:                         Family history of 1st-degree relative with colon polyps Providers:             Eather Colas MD, MD Referring MD:          Barbette Reichmann, MD (Referring MD) Medicines:             Monitored Anesthesia Care Complications:         No immediate complications. Estimated blood loss:                         Minimal. Procedure:             Pre-Anesthesia Assessment:                        - Prior to the procedure, a History and Physical was                         performed, and patient medications and allergies were                         reviewed. The patient is competent. The risks and                         benefits of the procedure and the sedation options and                         risks were discussed with the patient. All questions                         were answered and informed consent was obtained.                         Patient identification and proposed procedure were                         verified by the physician, the nurse, the                         anesthesiologist, the anesthetist and the technician                         in the endoscopy suite. Mental Status Examination:                         alert and oriented. Airway Examination: normal                         oropharyngeal airway and neck mobility. Respiratory  Examination: clear to auscultation. CV Examination:                         normal. Prophylactic Antibiotics: The patient does not                         require prophylactic antibiotics.  Prior                         Anticoagulants: The patient has taken no anticoagulant                         or antiplatelet agents. ASA Grade Assessment: II - A                         patient with mild systemic disease. After reviewing                         the risks and benefits, the patient was deemed in                         satisfactory condition to undergo the procedure. The                         anesthesia plan was to use monitored anesthesia care                         (MAC). Immediately prior to administration of                         medications, the patient was re-assessed for adequacy                         to receive sedatives. The heart rate, respiratory                         rate, oxygen saturations, blood pressure, adequacy of                         pulmonary ventilation, and response to care were                         monitored throughout the procedure. The physical                         status of the patient was re-assessed after the                         procedure.                        After obtaining informed consent, the colonoscope was                         passed under direct vision. Throughout the procedure,                         the patient's blood pressure, pulse, and oxygen  saturations were monitored continuously. The                         Colonoscope was introduced through the anus and                         advanced to the the cecum, identified by appendiceal                         orifice and ileocecal valve. The colonoscopy was                         performed without difficulty. The patient tolerated                         the procedure well. The quality of the bowel                         preparation was adequate to identify polyps. The                         ileocecal valve, appendiceal orifice, and rectum were                         photographed. Findings:      The perianal and digital rectal  examinations were normal.      A few small-mouthed diverticula were found in the ascending colon.      A diffuse area of mild melanosis was found in the cecum.      A localized area of granular mucosa was found at the anus. Biopsies were       taken with a cold forceps for histology. Estimated blood loss was       minimal.      Internal hemorrhoids were found during retroflexion. The hemorrhoids       were Grade I (internal hemorrhoids that do not prolapse).      The exam was otherwise without abnormality on direct and retroflexion       views. Impression:            - Diverticulosis in the ascending colon.                        - Melanosis in the colon.                        - Granularity at the anus. Biopsied.                        - Internal hemorrhoids.                        - The examination was otherwise normal on direct and                         retroflexion views. Recommendation:        - Discharge patient to home.                        - Resume previous diet.                        -  Continue present medications.                        - Await pathology results.                        - Repeat colonoscopy for surveillance based on                         pathology results.                        - Return to referring physician as previously                         scheduled. Procedure Code(s):     --- Professional ---                        [phone], Colonoscopy, flexible; with biopsy, single or                         multiple Diagnosis Code(s):     --- Professional ---                        Z83.71, Family history of colonic polyps                        K64.0, First degree hemorrhoids                        K63.89, Other specified diseases of intestine                        K62.89, Other specified diseases of anus and rectum                        K57.30, Diverticulosis of large intestine without                         perforation or abscess without bleeding CPT  copyright 2022 [employer] Association. All rights reserved. The codes documented in this report are preliminary and upon coder review may  be revised to meet current compliance requirements. Eather Colas MD, MD 02/13/2023 8:29:56 AM Number of Addenda: 0 Note Initiated On: 02/13/2023 7:53 AM Scope Withdrawal Time: 0 hours 11 minutes 55 seconds  Total Procedure Duration: 0 hours 17 minutes 32 seconds  Estimated Blood Loss:  Estimated blood loss was minimal.      [facility]

## 2023-02-13 NOTE — Anesthesia Postprocedure Evaluation (Signed)
Anesthesia Post Note  Patient: Catherine Avery  Procedure(s) Performed: COLONOSCOPY WITH PROPOFOL  Patient location during evaluation: Endoscopy Anesthesia Type: General Level of consciousness: awake and alert Pain management: pain level controlled Vital Signs Assessment: post-procedure vital signs reviewed and stable Respiratory status: spontaneous breathing, nonlabored ventilation, respiratory function stable and patient connected to nasal cannula oxygen Cardiovascular status: blood pressure returned to baseline and stable Postop Assessment: no apparent nausea or vomiting Anesthetic complications: no   No notable events documented.   Last Vitals:  Vitals:   02/13/23 0837 02/13/23 0847  BP: 111/85 (!) 97/57  Pulse:    Resp:    Temp:    SpO2: 100% 100%    Last Pain:  Vitals:   02/13/23 0847  TempSrc:   PainSc: 0-No pain                 Lenard Simmer

## 2023-02-13 NOTE — Transfer of Care (Signed)
Immediate Anesthesia Transfer of Care Note  Patient: DEYANNA JABOUR  Procedure(s) Performed: COLONOSCOPY WITH PROPOFOL  Patient Location: PACU  Anesthesia Type:General  Level of Consciousness: awake, alert , and oriented  Airway & Oxygen Therapy: Patient Spontanous Breathing  Post-op Assessment: Report given to RN and Post -op Vital signs reviewed and stable  Post vital signs: stable  Last Vitals:  Vitals Value Taken Time  BP 100/49 02/13/23 0828  Temp    Pulse 57 02/13/23 0829  Resp 10 02/13/23 0831  SpO2 100 % 02/13/23 0829  Vitals shown include unfiled device data.  Last Pain:  Vitals:   02/13/23 0721  TempSrc: Temporal  PainSc: 0-No pain         Complications: No notable events documented.
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
## 2023-02-13 NOTE — Interval H&P Note (Signed)
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  The various methods of treatment have been discussed with the patient and family. After consideration of risks, benefits and other options for treatment, the patient has consented to  Procedure(s): COLONOSCOPY WITH PROPOFOL (N/A) as a surgical intervention.  The patient's history has been reviewed, patient examined, no change in status, stable for surgery.  I have reviewed the patient's chart and labs.  Questions were answered to the patient's satisfaction.     Regis Bill  Ok to proceed with colonoscopy
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
## 2023-02-14 ENCOUNTER — Encounter: Payer: Self-pay | Admitting: Gastroenterology

## 2023-02-20 LAB — SURGICAL PATHOLOGY

## 2023-06-23 ENCOUNTER — Other Ambulatory Visit: Payer: Self-pay | Admitting: Internal Medicine

## 2023-06-23 DIAGNOSIS — Z1231 Encounter for screening mammogram for malignant neoplasm of breast: Secondary | ICD-10-CM

## 2023-07-12 ENCOUNTER — Ambulatory Visit
Admission: RE | Admit: 2023-07-12 | Discharge: 2023-07-12 | Disposition: A | Discharge status: Home / Self Care | Source: Ambulatory Visit | Attending: Internal Medicine | Admitting: Internal Medicine

## 2023-07-12 DIAGNOSIS — Z1231 Encounter for screening mammogram for malignant neoplasm of breast: Secondary | ICD-10-CM | POA: Insufficient documentation

## 2023-08-16 NOTE — H&P (View-Only) (Signed)
 Pre-Procedure Evaluation  Procedure  Date/Time: 08/16/23 1256   Procedures:      ANORECTAL EXAM, SURGICAL, REQUIRING ANESTHESIA (GENERAL, SPINAL, OR EPIDURAL), DIAGNOSTIC     DESTRUCTION OF LESION(S), ANUS (EG, CONDYLOMA, PAPILLOMA, MOLLUSCUM CONTAGIOSUM, HERPETIC VESICLE), EXTENSIVE (EG, LASER SURGERY, ELECTROSURGERY, CRYOSURGERY, CHEMOSURGERY)   Location: [facility] / [facility]   Surgeons: Mariea Sherida Cap Ang, MD        Anesthesia Evaluation Negative for history of anesthetic complications  Pulmonary: (+) pneumonia   Cardiovascular: (+) pacemaker    Neuro/Psych: (+) CVA   GI/Hepatic/Renal:    Endocrine:   Hematology/Oncology: (+) anemia    Musculoskeletal:         Preprocedure Exam  Airway: Mallampati: II - soft palate, uvula, fauces visible. TM distance: >3 FB. Neck ROM: full.   Dental: . no notable dental hx    Cardiovascular: . cardiovascular exam normal   Pulmonary: pulmonary exam normal            Anesthesia Plan  ASA 3   general  (Native airway) Anesthetic plan and risks discussed with patient.

## 2023-08-16 NOTE — Interval H&P Note (Signed)
 The H&P has been reviewed and the patient has been examined. There is no change in the overall  assessment and no contraindication for surgery.   Anal canal lesion seen on colonoscopy.  OR today for excision of anal lesion.  Consent signed.  BILLY YU ANG LAN, MD
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
## 2023-12-10 ENCOUNTER — Observation Stay

## 2023-12-10 ENCOUNTER — Observation Stay
Admission: EM | Admit: 2023-12-10 | Discharge: 2023-12-12 | Disposition: A | Discharge status: Home / Self Care | Attending: Hospitalist | Admitting: Hospitalist

## 2023-12-10 ENCOUNTER — Emergency Department

## 2023-12-10 ENCOUNTER — Other Ambulatory Visit: Payer: Self-pay

## 2023-12-10 ENCOUNTER — Encounter: Payer: Self-pay | Admitting: Internal Medicine

## 2023-12-10 DIAGNOSIS — R2990 Unspecified symptoms and signs involving the nervous system: Principal | ICD-10-CM | POA: Diagnosis present

## 2023-12-10 DIAGNOSIS — Z91040 Latex allergy status: Secondary | ICD-10-CM | POA: Insufficient documentation

## 2023-12-10 DIAGNOSIS — Z8673 Personal history of transient ischemic attack (TIA), and cerebral infarction without residual deficits: Secondary | ICD-10-CM

## 2023-12-10 DIAGNOSIS — G909 Disorder of the autonomic nervous system, unspecified: Secondary | ICD-10-CM | POA: Insufficient documentation

## 2023-12-10 DIAGNOSIS — G8192 Hemiplegia, unspecified affecting left dominant side: Principal | ICD-10-CM | POA: Insufficient documentation

## 2023-12-10 DIAGNOSIS — R32 Unspecified urinary incontinence: Secondary | ICD-10-CM | POA: Insufficient documentation

## 2023-12-10 DIAGNOSIS — I959 Hypotension, unspecified: Secondary | ICD-10-CM | POA: Insufficient documentation

## 2023-12-10 DIAGNOSIS — Z959 Presence of cardiac and vascular implant and graft, unspecified: Secondary | ICD-10-CM | POA: Insufficient documentation

## 2023-12-10 DIAGNOSIS — R531 Weakness: Secondary | ICD-10-CM | POA: Diagnosis not present

## 2023-12-10 DIAGNOSIS — G629 Polyneuropathy, unspecified: Secondary | ICD-10-CM | POA: Insufficient documentation

## 2023-12-10 DIAGNOSIS — Z950 Presence of cardiac pacemaker: Secondary | ICD-10-CM | POA: Diagnosis not present

## 2023-12-10 DIAGNOSIS — R471 Dysarthria and anarthria: Secondary | ICD-10-CM

## 2023-12-10 DIAGNOSIS — R262 Difficulty in walking, not elsewhere classified: Secondary | ICD-10-CM

## 2023-12-10 DIAGNOSIS — D573 Sickle-cell trait: Secondary | ICD-10-CM | POA: Insufficient documentation

## 2023-12-10 DIAGNOSIS — Z7982 Long term (current) use of aspirin: Secondary | ICD-10-CM | POA: Insufficient documentation

## 2023-12-10 DIAGNOSIS — G901 Familial dysautonomia [Riley-Day]: Secondary | ICD-10-CM

## 2023-12-10 LAB — URINALYSIS, ROUTINE W REFLEX MICROSCOPIC
Bilirubin Urine: NEGATIVE
Glucose, UA: NEGATIVE mg/dL
Hgb urine dipstick: NEGATIVE
Ketones, ur: NEGATIVE mg/dL
Leukocytes,Ua: NEGATIVE
Nitrite: NEGATIVE
Protein, ur: NEGATIVE mg/dL
Specific Gravity, Urine: 1.011 (ref 1.005–1.030)
pH: 5 (ref 5.0–8.0)

## 2023-12-10 LAB — URINE DRUG SCREEN, QUALITATIVE (ARMC ONLY)
Amphetamines, Ur Screen: NOT DETECTED
Barbiturates, Ur Screen: NOT DETECTED
Benzodiazepine, Ur Scrn: NOT DETECTED
Cannabinoid 50 Ng, Ur ~~LOC~~: NOT DETECTED
Cocaine Metabolite,Ur ~~LOC~~: NOT DETECTED
MDMA (Ecstasy)Ur Screen: NOT DETECTED
Methadone Scn, Ur: NOT DETECTED
Opiate, Ur Screen: NOT DETECTED
Phencyclidine (PCP) Ur S: NOT DETECTED
Tricyclic, Ur Screen: NOT DETECTED

## 2023-12-10 LAB — COMPREHENSIVE METABOLIC PANEL WITH GFR
ALT: 15 U/L (ref 0–44)
AST: 20 U/L (ref 15–41)
Albumin: 3.9 g/dL (ref 3.5–5.0)
Alkaline Phosphatase: 75 U/L (ref 38–126)
Anion gap: 7 (ref 5–15)
BUN: 23 mg/dL — ABNORMAL HIGH (ref 6–20)
CO2: 24 mmol/L (ref 22–32)
Calcium: 8.9 mg/dL (ref 8.9–10.3)
Chloride: 112 mmol/L — ABNORMAL HIGH (ref 98–111)
Creatinine, Ser: 0.94 mg/dL (ref 0.44–1.00)
GFR, Estimated: >60 mL/min (ref >60)
Glucose, Bld: 103 mg/dL — ABNORMAL HIGH (ref 70–99)
Potassium: 3.8 mmol/L (ref 3.5–5.1)
Sodium: 143 mmol/L (ref 135–145)
Total Bilirubin: 0.5 mg/dL (ref 0.0–1.2)
Total Protein: 7 g/dL (ref 6.5–8.1)

## 2023-12-10 LAB — CBC WITH DIFFERENTIAL/PLATELET
Abs Immature Granulocytes: 0.01 K/uL (ref 0.00–0.07)
Basophils Absolute: 0.1 K/uL (ref 0.0–0.1)
Basophils Relative: 1 %
Eosinophils Absolute: 0.2 K/uL (ref 0.0–0.5)
Eosinophils Relative: 4 %
HCT: 34.7 % — ABNORMAL LOW (ref 36.0–46.0)
Hemoglobin: 11.3 g/dL — ABNORMAL LOW (ref 12.0–15.0)
Immature Granulocytes: 0 %
Lymphocytes Relative: 18 %
Lymphs Abs: 1 K/uL (ref 0.7–4.0)
MCH: 28.7 pg (ref 26.0–34.0)
MCHC: 32.6 g/dL (ref 30.0–36.0)
MCV: 88.1 fL (ref 80.0–100.0)
Monocytes Absolute: 0.6 K/uL (ref 0.1–1.0)
Monocytes Relative: 11 %
Neutro Abs: 3.7 K/uL (ref 1.7–7.7)
Neutrophils Relative %: 66 %
Platelets: 184 K/uL (ref 150–400)
RBC: 3.94 MIL/uL (ref 3.87–5.11)
RDW: 12.7 % (ref 11.5–15.5)
WBC: 5.6 K/uL (ref 4.0–10.5)
nRBC: 0 % (ref 0.0–0.2)

## 2023-12-10 LAB — LACTIC ACID, PLASMA: Lactic Acid, Venous: 1.1 mmol/L (ref 0.5–1.9)

## 2023-12-10 LAB — PROTIME-INR
INR: 0.9 (ref 0.8–1.2)
Prothrombin Time: 12.7 s (ref 11.4–15.2)

## 2023-12-10 LAB — LIPASE, BLOOD: Lipase: 28 U/L (ref 11–51)

## 2023-12-10 LAB — RESP PANEL BY RT-PCR (RSV, FLU A&B, COVID)  RVPGX2
Influenza A by PCR: NEGATIVE
Influenza B by PCR: NEGATIVE
Resp Syncytial Virus by PCR: NEGATIVE
SARS Coronavirus 2 by RT PCR: NEGATIVE

## 2023-12-10 LAB — TROPONIN I (HIGH SENSITIVITY)
Troponin I (High Sensitivity): 3 ng/L (ref <18)
Troponin I (High Sensitivity): 3 ng/L (ref <18)

## 2023-12-10 LAB — GLUCOSE, CAPILLARY: Glucose-Capillary: 90 mg/dL (ref 70–99)

## 2023-12-10 MED ORDER — GABAPENTIN 300 MG PO CAPS
300.0000 mg | ORAL_CAPSULE | Freq: Every day | ORAL | Status: DC
  Administered 2023-12-10 – 2023-12-11 (×2): 300 mg via ORAL
  Filled 2023-12-10 (×2): qty 1

## 2023-12-10 MED ORDER — ACETAMINOPHEN 650 MG RE SUPP: 650.0000 mg | RECTAL | Status: DC | PRN

## 2023-12-10 MED ORDER — SENNOSIDES-DOCUSATE SODIUM 8.6-50 MG PO TABS: 1.0000 | ORAL_TABLET | Freq: Every evening | ORAL | Status: DC | PRN

## 2023-12-10 MED ORDER — GABAPENTIN 300 MG PO CAPS: 300.0000 mg | ORAL_CAPSULE | ORAL | Status: DC

## 2023-12-10 MED ORDER — ACETAMINOPHEN 160 MG/5ML PO SOLN: 650.0000 mg | ORAL | Status: DC | PRN

## 2023-12-10 MED ORDER — ACETAMINOPHEN 325 MG PO TABS
650.0000 mg | ORAL_TABLET | ORAL | Status: DC | PRN
Start: 2023-12-10 — End: 2023-12-12
  Administered 2023-12-10: 650 mg via ORAL
  Filled 2023-12-10: qty 2

## 2023-12-10 MED ORDER — VITAMIN B-12 1000 MCG PO TABS
1000.0000 ug | ORAL_TABLET | Freq: Every day | ORAL | Status: DC
  Administered 2023-12-10 – 2023-12-12 (×3): 1000 ug via ORAL
  Filled 2023-12-10 (×3): qty 1

## 2023-12-10 MED ORDER — FERROUS SULFATE 325 (65 FE) MG PO TABS
325.0000 mg | ORAL_TABLET | Freq: Every morning | ORAL | Status: DC
  Administered 2023-12-10 – 2023-12-12 (×3): 325 mg via ORAL
  Filled 2023-12-10 (×3): qty 1

## 2023-12-10 MED ORDER — STROKE: EARLY STAGES OF RECOVERY BOOK: Freq: Once | Status: AC

## 2023-12-10 MED ORDER — GABAPENTIN 300 MG PO CAPS
300.0000 mg | ORAL_CAPSULE | Freq: Every day | ORAL | Status: DC
  Administered 2023-12-10 – 2023-12-12 (×3): 300 mg via ORAL
  Filled 2023-12-10 (×3): qty 1

## 2023-12-10 MED ORDER — VITAMIN D 25 MCG (1000 UNIT) PO TABS
1000.0000 [IU] | ORAL_TABLET | Freq: Every day | ORAL | Status: DC
  Administered 2023-12-10 – 2023-12-12 (×3): 1000 [IU] via ORAL
  Filled 2023-12-10 (×3): qty 1

## 2023-12-10 MED ORDER — GABAPENTIN 300 MG PO CAPS
900.0000 mg | ORAL_CAPSULE | Freq: Every day | ORAL | Status: DC
  Administered 2023-12-10 – 2023-12-11 (×2): 900 mg via ORAL
  Filled 2023-12-10 (×2): qty 3

## 2023-12-10 NOTE — Hospital Course (Signed)
 Catherine Avery is a 58 year old female with history of bradycardia, neuropathy, history of CVA, TIA with left-sided hemiplegia, pacemaker status, who presents to ED for chief concerns of weakness, lethargy, slurred speech.  Patient fell at home.  Vitals in the ED showed t of 97.7, rr of 18, heart rate 50, blood pressure 142/77, SpO2 100% on room air.  Serum sodium is 143, potassium 3.8, chloride 112, bicarb 24, BUN of 23, serum creatinine 0.94, eGFR greater than 60, nonfasting blood glucose 103, WBC 5.6, hemoglobin 11.3, platelets of 184.  HS troponin is 3.  Lactic acid 1.1.  COVID/influenza A/influenza B/RSV PCR were negative.  UA was negative for leukocytes and nitrates.  ED treatment: None.

## 2023-12-10 NOTE — Significant Event (Addendum)
 Rapid response was called at approximately 9:00a  I presented to bedside.  Patient is afebrile, blood pressure 134/68, respiration rate of 10, heart rate 53, SpO2 100% on room air.  Patient is laying in bed and protecting her airway.  Neurologist was also at bedside.  Patient does exhibit baseline left sided weakness.  When asked to move her bilateral lower extremity, she states that it just feels really heavy.  Patient also endorses right-sided facial pain and headache.  She also endorses blurry vision.  # Strokelike symptoms-query evolving stroke # Versus psychosomatic symptoms - CT head without contrast repeat scan ordered - Acetaminophen  as needed for headache - Pending CT scan and if it is negative, will consult behavioral health service  Addendum: CT head without contrast, read as no acute intracranial abnormality, no significant interval change.  Normal CT head for age. - Behavioral health has been consulted  Dr. Sherre

## 2023-12-10 NOTE — Significant Event (Signed)
 Rapid Response Event Note   Reason for Call : called RRT for possible stroke   Initial Focused Assessment: Dr Sherre and Dr Matthews at bedside, full neuro exam by Dr Matthews.      Interventions:Stat head CT    Plan of Care: as above    Event Summary:   MD Notified: Dr Sherre, Dr Matthews Call Upfz:[id number] Arrival Upfz:9097 End Time:0910  Ryka Beighley A, RN

## 2023-12-10 NOTE — ED Provider Notes (Signed)
 [facility] Provider Note    Event Date/Time   First MD Initiated Contact with Patient 12/10/23 [phone]     (approximate)   History   Fall (Legs gave out when attempted to get out of bed. Denies LOC. C/o left leg pain. Found to be lethargic/altered mental status/slow to respond to questions. BS=168. Bradycardic 50s. Hx of pacemaker/medtronic. )   HPI Catherine Avery is a 58 y.o. female who has an extensive history of medical issues that include but are not limited to neuropathy, cervical myelopathy, edema of spinal cord, neuropathic pain of both legs, prior cervical spine fracture (C2), etc.  She presents by EMS for evaluation of a fall at home.  She said that she sleeps in a hospital bed and has a bedside commode.  She got up to go to the bathroom and her legs gave out causing her to fall.  She said that her left leg has burning pain and that she feels like she cannot walk because her legs are so weak.  However when asked what her primary concern is, she states I am just so tired.  (It is currently approximately 4:00 in the morning.)  Of note, she says that she went to bed early last night at 8 PM, so her last known normal was at least 8 hours ago.  She denies headache, neck pain, chest pain, abdominal pain, shortness of breath, nausea, vomiting, dysuria.     Physical Exam   ED Triage Vitals  Encounter Vitals Group     BP 12/10/23 0355 (!) 142/77     Girls Systolic BP Percentile --      Girls Diastolic BP Percentile --      Boys Systolic BP Percentile --      Boys Diastolic BP Percentile --      Pulse Rate 12/10/23 0355 (!) 50     Resp 12/10/23 0355 18     Temp 12/10/23 0355 97.7 F (36.5 C)     Temp Source 12/10/23 0355 Rectal     SpO2 12/10/23 0348 100 %     Weight 12/10/23 0401 100 kg (220 lb 7.4 oz)     Height 12/10/23 0401 1.626 m (5' 4)     Head Circumference --      Peak Flow --      Pain Score 12/10/23 0401 7     Pain Loc --      Pain  Education --      Exclude from Growth Chart --       Most recent vital signs: Vitals:   12/10/23 0630 12/10/23 0700  BP: (!) 117/56 (!) 118/53  Pulse: (!) 50 (!) 57  Resp: 16 20  Temp:    SpO2:      General: Awake, alert.  She seems to be occasionally slow blinking her left eye or keeping the left eye closed but can and does open spontaneously.  Her extraocular movement is intact. CV:  Good peripheral perfusion.  Heart rate of 50, pacemaker in place. Resp:  Normal effort. Speaking easily and comfortably, no accessory muscle usage nor intercostal retractions.  Lungs clear to auscultation. Abd:  Obese, soft, nontender to palpation. Neuro:  The patient is exhibiting some slurred speech/dysarthria, unclear acuity.  The patient has some chronic left-sided weakness but no obvious facial droop.  She is able to raise her arms and legs but has minimal strength bilaterally and I cannot appreciate an obvious difference between sides.  She has  a little bit of left eye droop which is unclear to me if this is baseline.  NIHSS 2 Other:  Patient is reporting burning pain in the outside of her left upper leg but has no visible deformities and both legs appear equal in size.  We were able to roll her onto her left leg so that she could be changed (she had lost control of her bladder since she never got to go to the bathroom due to her fall) and so that we can check a rectal temperature, and she had no worsening of the pain when she was rolled directly onto her left hip.  She has no apparent pain with my passive range of motion of her left hip and knee.  There is no erythema or increase in size of the left leg compared to the right.  Peripheral perfusion is appropriate.   ED Results / Procedures / Treatments   Labs (all labs ordered are listed, but only abnormal results are displayed) Labs Reviewed  COMPREHENSIVE METABOLIC PANEL WITH GFR - Abnormal; Notable for the following components:      Result Value    Chloride 112 (*)    Glucose, Bld 103 (*)    BUN 23 (*)    All other components within normal limits  CBC WITH DIFFERENTIAL/PLATELET - Abnormal; Notable for the following components:   Hemoglobin 11.3 (*)    HCT 34.7 (*)    All other components within normal limits  URINALYSIS, ROUTINE W REFLEX MICROSCOPIC - Abnormal; Notable for the following components:   Color, Urine YELLOW (*)    APPearance CLEAR (*)    All other components within normal limits  CULTURE, BLOOD (SINGLE)  RESP PANEL BY RT-PCR (RSV, FLU A&B, COVID)  RVPGX2  LACTIC ACID, PLASMA  PROTIME-INR  LIPASE, BLOOD  HIV ANTIBODY (ROUTINE TESTING W REFLEX)  URINE DRUG SCREEN, QUALITATIVE ([facility] ONLY)  TROPONIN I (HIGH SENSITIVITY)  TROPONIN I (HIGH SENSITIVITY)     EKG  ED ECG REPORT I, Darleene Dome, the attending physician, personally viewed and interpreted this ECG.  Date: 12/10/2023 EKG Time: 3:54 AM Rate: 86 Rhythm: normal sinus rhythm QRS Axis: normal Intervals: Nonspecific intraventricular conduction delay ST/T Wave abnormalities: Non-specific ST segment / T-wave changes, but no clear evidence of acute ischemia. Narrative Interpretation: no definitive evidence of acute ischemia; does not meet STEMI criteria.    RADIOLOGY See ED course for details   PROCEDURES:  Critical Care performed: No  .1-3 Lead EKG Interpretation  Performed by: Dome Darleene, MD Authorized by: Dome Darleene, MD     Interpretation: abnormal     ECG rate:  50   ECG rate assessment: bradycardic     Rhythm: sinus bradycardia     Ectopy: none     Conduction: normal       IMPRESSION / MDM / ASSESSMENT AND PLAN / ED COURSE  I reviewed the triage vital signs and the nursing notes.                              Differential diagnosis includes, but is not limited to, acute intracranial injury or CVA, viral illness, bacterial infection such as pneumonia or urinary tract infection, acute on chronic neuropathy, electrolyte or  metabolic abnormality.  Patient's presentation is most consistent with acute presentation with potential threat to life or bodily function.  Labs/studies ordered: CT head, chest x-ray, EKG, urinalysis, blood culture, CBC with differential, lactic acid, respiratory  viral panel, lipase, CMP, high-sensitivity troponin, pro time-INR  Interventions/Medications given:  Medications   stroke: early stages of recovery book (has no administration in time range)  acetaminophen  (TYLENOL ) tablet 650 mg (has no administration in time range)    Or  acetaminophen  (TYLENOL ) 160 MG/5ML solution 650 mg (has no administration in time range)    Or  acetaminophen  (TYLENOL ) suppository 650 mg (has no administration in time range)  senna-docusate (Senokot-S) tablet 1 tablet (has no administration in time range)    (Note:  hospital course my include additional interventions and/or labs/studies not listed above.)   Patient's vitals notable for bradycardia at a rate of 50 but otherwise unremarkable.  She is somewhat tearful and seems to have a hard time expressing her concern.  She says something does not feel right and I am just so tired.  She reiterates that she has a burning pain to her right upper leg but it is unclear if this is representative of her typical neuropathy.  Due to her pacemaker she is not a candidate for MRI but I also appreciate no focal neurological deficits other than the possibility of the dysarthria.  She can move her arms and legs and has no reproducible pain or tenderness with manipulation of the left leg.  She has no indication of aphasia nor dysarthria.  I will evaluate broadly to see to identify any specific acute cause of her symptoms.  No clear indication for a specific medication at this time.  I reviewed her medical record including clinic notes by her PCP and he has no documentation of dysarthria/Slurred speech, just mentions the chronic left-sided weakness.  The patient is on the  cardiac monitor to evaluate for evidence of arrhythmia and/or significant heart rate changes.   Clinical Course as of 12/10/23 0734  Sun Dec 10, 2023  9546 CT Head Wo Contrast I independently viewed and interpreted the patient's head CT and I see no evidence of acute intracranial hemorrhage, neoplasm, nor any obvious CVA.  I also read the radiologist's report, which confirmed no acute findings. [CF]  0453 DG Chest Port 1 View I independently viewed and interpreted the patient's chest x-ray(s), and I also reviewed the radiologist's report.  No evidence of pneumonia, pneumothorax, pulmonary edema, nor other acute abnormality. [CF]  0523 Lactic acid, CMP, lipase, CBC with differential all within normal limits.  High sensitive troponin is also within normal limits.  Urinalysis pending. [CF]  9383 Urinalysis, Routine w reflex microscopic -Urine, Clean Catch(!) Normal urinalysis [CF]  9367 I reassessed the patient.  She still does not feel back to normal.  She said that her legs feel too weak for her to walk.  She continues to respond slowly and with at least some degree of slurred speech.  We called her sister and I spoke to the sister over the speaker phone.  The sister confirmed that she is not at acting like herself and that she is slurring her speech which she does not typically do.  I cannot order an MRI at this time given the patient's pacemaker, but I am concerned about her strokelike symptoms.  I will consult the hospitalist for admission.  She is well outside the window for thrombolytics and there is no indication for code stroke as she is VAN negative and LVO is less likely. [CF]  9351 I am consulting the hospitalist team for admission.   [CF]  [phone] Also ordering stroke swallow screen [CF]  0730 I consulted by phone with the  admitting hospitalist, and they will admit the patient - Dr. Sherre [CF]    Clinical Course User Index [CF] Gordan Huxley, MD     FINAL CLINICAL IMPRESSION(S) / ED  DIAGNOSES   Final diagnoses:  Stroke-like symptoms  Dysarthria  Inability to walk  Left-sided weakness  History of CVA (cerebrovascular accident)     Rx / DC Orders   ED Discharge Orders     None        Note:  This document was prepared using Dragon voice recognition software and may include unintentional dictation errors.   Gordan Huxley, MD 12/10/23 [phone]

## 2023-12-10 NOTE — ED Notes (Signed)
 Advised nurse that patient has ready bed

## 2023-12-10 NOTE — Assessment & Plan Note (Signed)
 Neurology has been consulted and we appreciate further recommendations MRI of the brain was considered however given patient's pacemaker status, we will repeat CT head wo contrast tomorrow Fasting lipid and A1c ordered Permissive hypertension per neurology recommendations Frequent neuro vascular checks N.p.o. pending swallow study PT, OT Fall precaution

## 2023-12-10 NOTE — Assessment & Plan Note (Signed)
 Status post pacemaker status

## 2023-12-10 NOTE — Assessment & Plan Note (Addendum)
 PDMP reviewed Current active prescription includes gabapentin  300 mg capsule, quantity is 450 capsules, for 90-day supply filled on 10/31/2023

## 2023-12-10 NOTE — H&P (Addendum)
 History and Physical   Catherine Avery FMW:[id number] DOB: [birth year] DOA: 12/10/2023  PCP: [name], MD  Patient coming from: Home  I have personally briefly reviewed patient's old medical records in [facility] EMR.  Chief Concern: Slurred speech  HPI: Catherine Avery is a 58 year old female with history of bradycardia, neuropathy, history of CVA, TIA with left-sided hemiplegia, pacemaker status, who presents to ED for chief concerns of weakness, lethargy, slurred speech.  Patient fell at home.  Vitals in the ED showed t of 97.7, rr of 18, heart rate 50, blood pressure 142/77, SpO2 100% on room air.  Serum sodium is 143, potassium 3.8, chloride 112, bicarb 24, BUN of 23, serum creatinine 0.94, eGFR greater than 60, nonfasting blood glucose 103, WBC 5.6, hemoglobin 11.3, platelets of 184.  HS troponin is 3.  Lactic acid 1.1.  COVID/influenza A/influenza B/RSV PCR were negative.  UA was negative for leukocytes and nitrates.  ED treatment: None. ----------------------------------------- At bedside, patient able to tell me her first and last name, age, location, current calendar year.  She is slurring her speech.  She reports that she was getting up to use the bedside commode next to her hospital bed at home when she felt left leg pain that is not her normal.  She describes the pain as burning like she cannot walk.  She felt very weak.  She fell onto her face.  She denies loss of consciousness.  She denies chest pain, shortness of breath, dysuria, hematuria, diarrhea, vision changes.  At bedside, she is tearful with me.  She states she lives on her own.  Social history: He lives on her own.  She denies tobacco, EtOH, recreational drug use.  She is disabled.  ROS: Constitutional: no weight change, no fever ENT/Mouth: no sore throat, no rhinorrhea Eyes: no eye pain, no vision changes Cardiovascular: no chest pain, no dyspnea,  no edema, no palpitations Respiratory: no  cough, no sputum, no wheezing Gastrointestinal: no nausea, no vomiting, no diarrhea, no constipation Genitourinary: no urinary incontinence, no dysuria, no hematuria Musculoskeletal: no arthralgias, no myalgias Skin: no skin lesions, no pruritus, Neuro: + weakness, no loss of consciousness, no syncope, + left leg burning sensation Psych: no anxiety, no depression, no decrease appetite Heme/Lymph: no bruising, no bleeding  ED Course: Discussed with EDP, patient requiring hospitalization for chief concerns of slurred speech, strokelike symptoms.  Assessment/Plan  Principal Problem:   Stroke-like symptom Active Problems:   Neuropathy   Hemiplegia affecting left dominant side (HCC)   History of stroke   Sickle cell trait   Dysautonomia (HCC)   Status cardiac pacemaker   Assessment and Plan:  * Stroke-like symptom Neurology has been consulted and we appreciate further recommendations MRI of the brain was considered however given patient's pacemaker status, we will repeat CT head wo contrast tomorrow Fasting lipid and A1c ordered Permissive hypertension per neurology recommendations Frequent neuro vascular checks N.p.o. pending swallow study PT, OT Fall precaution  Dysautonomia [facility]) Status post pacemaker status  Hemiplegia affecting left dominant side (HCC) Currently at baseline  Neuropathy PDMP reviewed Current active prescription includes gabapentin  300 mg capsule, quantity is 450 capsules, for 90-day supply filled on 10/31/2023  Chart reviewed.   DVT prophylaxis: Pharmacologic DVT not initiated on admission.  AM team to initiate pharmacologic DVT when the benefits outweigh the risk.  Code Status: Full code Diet: Regular diet Family Communication: No Disposition Plan: Pending clinical course Consults called: Neurology Admission status: Telemetry medical, observation  Past  Medical History:  Diagnosis Date   Arthritis    C2 cervical fracture (HCC)    Carpal tunnel  syndrome of left wrist    Cervical myelopathy (HCC)    Edema of spinal cord (HCC)    History of cardiac arrest    History of TIA (transient ischemic attack)    Hypomagnesemia    IDA (iron deficiency anemia)    Low vitamin D level    Neuropathic pain of both legs    Neuropathy    Osteoarthritis    Pneumonia    Presence of permanent cardiac pacemaker    Type 2 diabetes mellitus (HCC)    Past Surgical History:  Procedure Laterality Date   BIOPSY  02/13/2023   Procedure: BIOPSY;  Surgeon: Maryruth Ole DASEN, MD;  Location: [facility] ENDOSCOPY;  Service: Endoscopy;;   CERVIX SURGERY     COLONOSCOPY WITH PROPOFOL  N/A 02/13/2023   Procedure: COLONOSCOPY WITH PROPOFOL ;  Surgeon: Maryruth Ole DASEN, MD;  Location: [facility] ENDOSCOPY;  Service: Endoscopy;  Laterality: N/A;   CORPECTOMY CERVICLE     W/DECOMPRESSION BY ANTERIOR APPROACH   ENDOMETRIAL ABLATION     HerOption     Social History:  reports that she has never smoked. She has never used smokeless tobacco. She reports that she does not currently use alcohol. She reports that she does not currently use drugs.  Allergies  Allergen Reactions   Latex Itching   Family History  Problem Relation Age of Onset   Breast cancer Maternal Aunt    Breast cancer Paternal Aunt    Family history: Family history reviewed and not pertinent  Prior to Admission medications   Medication Sig Start Date End Date Taking? Authorizing Provider  acetaminophen  (TYLENOL ) 650 MG suppository Place 1,300 mg rectally every 8 (eight) hours as needed.    [provider]  cholecalciferol (VITAMIN D3) 25 MCG (1000 UT) tablet Take 1,000 Units by mouth daily.    [provider]  ferrous sulfate 324 (65 Fe) MG TBEC Take 1 tablet by mouth every morning. 04/14/21   [provider]  gabapentin  (NEURONTIN ) 300 MG capsule Take by mouth. 04/26/21   [provider]  iron polysaccharides (NU-IRON) 150 MG capsule Take 150 mg by mouth daily.  09/06/18 09/06/19  [provider]  senna (SENOKOT) 8.6 MG tablet Take 1 tablet by mouth daily.    [provider]  vitamin B-12 (CYANOCOBALAMIN) 1000 MCG tablet Take 1,000 mcg by mouth daily.     [provider]   Physical Exam: Vitals:   12/10/23 0630 12/10/23 0700 12/10/23 0825 12/10/23 0907  BP: (!) 117/56 (!) 118/53 (!) 110/48 134/68  Pulse: (!) 50 (!) 57 (!) 50 83  Resp: 16 20 16    Temp:   97.6 F (36.4 C)   TempSrc:      SpO2:   100% 100%  Weight:      Height:       Constitutional: appears age-appropriate Eyes: PERRL, lids and conjunctivae normal ENMT: Mucous membranes are moist. Posterior pharynx clear of any exudate or lesions. Age-appropriate dentition. Hearing appropriate Neck: normal, supple, no masses, no thyromegaly Respiratory: clear to auscultation bilaterally, no wheezing, no crackles. Normal respiratory effort. No accessory muscle use.  Cardiovascular: Regular rate and rhythm, no murmurs / rubs / gallops. No extremity edema. 2+ pedal pulses. No carotid bruits.  Abdomen: Obese abdomen, no tenderness, no masses palpated, no hepatosplenomegaly. Bowel sounds positive.  Musculoskeletal: no clubbing / cyanosis. No joint deformity upper and lower  extremities. Good ROM, no contractures, no atrophy. Normal muscle tone.  Skin: no rashes, lesions, ulcers. No induration Neurologic: Sensation intact.  Equal strength in the left upper and lower extremity when compared to the right. Psychiatric: Normal judgment and insight. Alert and oriented x 3.  Depressed and tearful mood.   EKG: independently reviewed, showing sinus rhythm with rate of 86, QTc 487  Chest x-ray on Admission: I personally reviewed and I agree with radiologist reading as below.  DG Chest Port 1 View Result Date: 12/10/2023 EXAM: 1 VIEW(S) XRAY OF THE CHEST 12/10/2023 04:38:58 AM COMPARISON: CTA chest comparison 11/16/2018, and earlier. CLINICAL HISTORY: 58 year old female with  questionable sepsis, lethargy, and left leg pain. FINDINGS: LUNGS AND PLEURA: Mildly improved lung volumes. Left lower lobe ventilation improved from prior CTA, and when allowing for portable technique both lungs now appear clear. No pulmonary edema. No pleural effusion. No pneumothorax. HEART AND MEDIASTINUM: No acute abnormality of the cardiac and mediastinal silhouettes. BONES AND SOFT TISSUES: Partially visible anterior and posterior cervical spine fusion hardware. No acute osseous abnormality. ABDOMEN: Paucity of bowel gas. IMPRESSION: 1. No acute cardiopulmonary abnormality. Electronically signed by: Helayne Hurst MD 12/10/2023 04:44 AM EDT RP Workstation: [id number]   CT Head Wo Contrast Result Date: 12/10/2023 EXAM: CT HEAD WITHOUT CONTRAST 12/10/2023 04:33:57 AM TECHNIQUE: CT of the head was performed without the administration of intravenous contrast. Automated exposure control, iterative reconstruction, and/or weight based adjustment of the mA/kV was utilized to reduce the radiation dose to as low as reasonably achievable. COMPARISON: Brain MRI 09/21/2018 and head CT 08/23/2022. CLINICAL HISTORY: 58 year old female with acute neuro deficit, stroke suspected. Presents lethargic, slow to respond, and reports left leg pain. FINDINGS: BRAIN AND VENTRICLES: No acute hemorrhage. No evidence of acute infarct. No hydrocephalus. No extra-axial collection. No mass effect or midline shift. Brain volume is stable and within normal limits. No suspicious intracranial vascular hyperdensity. ORBITS: No acute abnormality. SINUSES: Paranasal sinuses, middle ears and mastoids are clear. SOFT TISSUES AND SKULL: No acute soft tissue abnormality. No skull fracture. Extensive chronic anterior and posterior cervical spine fusion hardware redemonstrated on the scout view. IMPRESSION: 1. Normal for age non-contrast CT appearance of the brain. Electronically signed by: Helayne Hurst MD 12/10/2023 04:41 AM EDT RP Workstation:  [id number]   Labs on Admission: I have personally reviewed following labs  CBC: Recent Labs  Lab 12/10/23 0410  WBC 5.6  NEUTROABS 3.7  HGB 11.3*  HCT 34.7*  MCV 88.1  PLT 184   Basic Metabolic Panel: Recent Labs  Lab 12/10/23 0410  NA 143  K 3.8  CL 112*  CO2 24  GLUCOSE 103*  BUN 23*  CREATININE 0.94  CALCIUM 8.9   GFR: Estimated Creatinine Clearance: 73.1 mL/min (by C-G formula based on SCr of 0.94 mg/dL).  Liver Function Tests: Recent Labs  Lab 12/10/23 0410  AST 20  ALT 15  ALKPHOS 75  BILITOT 0.5  PROT 7.0  ALBUMIN 3.9   Recent Labs  Lab 12/10/23 0410  LIPASE 28   Coagulation Profile: Recent Labs  Lab 12/10/23 0410  INR 0.9   Urine analysis:    Component Value Date/Time   COLORURINE YELLOW (A) 12/10/2023 0410   APPEARANCEUR CLEAR (A) 12/10/2023 0410   LABSPEC 1.011 12/10/2023 0410   PHURINE 5.0 12/10/2023 0410   GLUCOSEU NEGATIVE 12/10/2023 0410   HGBUR NEGATIVE 12/10/2023 0410   BILIRUBINUR NEGATIVE 12/10/2023 0410   KETONESUR NEGATIVE 12/10/2023 0410   PROTEINUR NEGATIVE 12/10/2023 0410  NITRITE NEGATIVE 12/10/2023 0410   LEUKOCYTESUR NEGATIVE 12/10/2023 0410   This document was prepared using Dragon Voice Recognition software and may include unintentional dictation errors.  Dr. Sherre [facility]  If 7PM-7AM, please contact overnight-coverage provider If 7AM-7PM, please contact day attending provider [email or web address]  12/10/2023, 10:19 AM

## 2023-12-10 NOTE — Assessment & Plan Note (Signed)
 Currently at baseline 

## 2023-12-10 NOTE — Plan of Care (Signed)
  Problem: Pain Managment: Goal: General experience of comfort will improve and/or be controlled Outcome: Progressing

## 2023-12-10 NOTE — ED Triage Notes (Signed)
 Presents lethargic and slow to respond to questions. Im so tired, my left leg hurts. Denies LOC with fall. Reports had a normal day yesterday but very tearful at moment.

## 2023-12-11 ENCOUNTER — Observation Stay
Admit: 2023-12-11 | Discharge: 2023-12-11 | Disposition: A | Discharge status: Home / Self Care | Attending: Hospitalist | Admitting: Hospitalist

## 2023-12-11 ENCOUNTER — Observation Stay

## 2023-12-11 DIAGNOSIS — G629 Polyneuropathy, unspecified: Secondary | ICD-10-CM

## 2023-12-11 DIAGNOSIS — Z8673 Personal history of transient ischemic attack (TIA), and cerebral infarction without residual deficits: Secondary | ICD-10-CM

## 2023-12-11 DIAGNOSIS — D573 Sickle-cell trait: Secondary | ICD-10-CM

## 2023-12-11 DIAGNOSIS — G901 Familial dysautonomia [Riley-Day]: Secondary | ICD-10-CM

## 2023-12-11 DIAGNOSIS — R2990 Unspecified symptoms and signs involving the nervous system: Secondary | ICD-10-CM | POA: Diagnosis not present

## 2023-12-11 DIAGNOSIS — G8192 Hemiplegia, unspecified affecting left dominant side: Secondary | ICD-10-CM | POA: Diagnosis not present

## 2023-12-11 DIAGNOSIS — Z950 Presence of cardiac pacemaker: Secondary | ICD-10-CM

## 2023-12-11 LAB — LIPID PANEL
Cholesterol: 189 mg/dL (ref 0–200)
HDL: 67 mg/dL (ref >40)
LDL Cholesterol: 109 mg/dL — ABNORMAL HIGH (ref 0–99)
Total CHOL/HDL Ratio: 2.8 ratio
Triglycerides: 67 mg/dL (ref <150)
VLDL: 13 mg/dL (ref 0–40)

## 2023-12-11 LAB — HEMOGLOBIN A1C
Hgb A1c MFr Bld: 4.9 % (ref 4.8–5.6)
Mean Plasma Glucose: 93.93 mg/dL

## 2023-12-11 MED ORDER — ENOXAPARIN SODIUM 60 MG/0.6ML IJ SOSY
50.0000 mg | PREFILLED_SYRINGE | INTRAMUSCULAR | Status: DC
  Administered 2023-12-11: 50 mg via SUBCUTANEOUS
  Filled 2023-12-11: qty 0.6

## 2023-12-11 MED ORDER — CLOPIDOGREL BISULFATE 75 MG PO TABS
75.0000 mg | ORAL_TABLET | Freq: Every day | ORAL | Status: DC
  Administered 2023-12-11 – 2023-12-12 (×2): 75 mg via ORAL
  Filled 2023-12-11 (×2): qty 1

## 2023-12-11 MED ORDER — IOHEXOL 350 MG/ML SOLN
75.0000 mL | Freq: Once | INTRAVENOUS | Status: AC | PRN
  Administered 2023-12-11: 75 mL via INTRAVENOUS

## 2023-12-11 MED ORDER — SODIUM CHLORIDE 0.9 % IV BOLUS
500.0000 mL | Freq: Once | INTRAVENOUS | Status: AC
  Administered 2023-12-11: 500 mL via INTRAVENOUS

## 2023-12-11 MED ORDER — ASPIRIN 81 MG PO TBEC
81.0000 mg | DELAYED_RELEASE_TABLET | Freq: Every day | ORAL | Status: DC
  Administered 2023-12-11 – 2023-12-12 (×2): 81 mg via ORAL
  Filled 2023-12-11 (×2): qty 1

## 2023-12-11 MED ORDER — MIDODRINE HCL 5 MG PO TABS
5.0000 mg | ORAL_TABLET | Freq: Three times a day (TID) | ORAL | Status: DC
  Administered 2023-12-11 – 2023-12-12 (×2): 5 mg via ORAL
  Filled 2023-12-11 (×2): qty 1

## 2023-12-11 MED ORDER — ATORVASTATIN CALCIUM 20 MG PO TABS
40.0000 mg | ORAL_TABLET | Freq: Every day | ORAL | Status: DC
  Administered 2023-12-11 – 2023-12-12 (×2): 40 mg via ORAL
  Filled 2023-12-11 (×2): qty 2

## 2023-12-11 NOTE — Consult Note (Signed)
 [facility] Psychiatric Consult Initial  Patient Name: .Catherine Avery  MRN: [id number]  DOB: 09-04-1965  Consult Order details:  Orders (From admission, onward)     Start     Ordered   12/10/23 1408  IP CONSULT TO PSYCHIATRY       Ordering Provider: Sherre Greig SAILOR, DO  Provider:  (Not yet assigned)  Question Answer Comment  Location [facility]   Reason for Consult? tearful, possible psychosomatic symptoms      12/10/23 1408             Mode of Visit: In person    Psychiatry Consult Evaluation  Service Date: December 11, 2023 LOS:  LOS: 0 days  Chief Complaint Stroke like symptoms  Primary Psychiatric Diagnoses  Stroke like symptoms   Assessment  Catherine Avery is a 58 y.o. female admitted: Medically  Psychiatry was consulted to assess patient due to increased tearfulness and potential psychosomatic symptoms reported by the medical team.  On assessment today patient is currently denying suicidal homicidal ideations.  She denied auditory or visual hallucinations.  We discussed her increased tearfulness recently, and patient reported just feeling overwhelmed with the recent events that have occurred in her life.On current presentation there was no evidence of psychosis or mania and patient did not appear to be responding to internal stimuli. At this time, patient does not appear to be a risk to self or others.While future psychiatric events cannot be accurately predicted, the patient does not currently require acute inpatient psychiatric care and does not currently meet [place]  involuntary commitment criteria.  Patient was agreeable to being aligned with potential CBT therapy options or support groups to follow-up with post discharge from the hospital.  We discussed medication options such as sertraline that could potentially help with the tearfulness and emotional symptoms from previous stroke and current presentation, patient politely declined at this  time.  Patient appears alert and oriented x 3 throughout this provider's assessment.  During exam, patient displayed logical, coherent and rational thought process.  Patient is not currently meeting criteria for forced nonemergent medications at this time.  Social work was informed of patient agreement to follow-up with CBT therapy options or support groups and will work on resources for patient.  At this time, psychiatry will sign off, please notify psychiatry team of any changes in patient condition or any further needs.  Diagnoses:  Active Hospital problems: Principal Problem:   Stroke-like symptom Active Problems:   Neuropathy   Hemiplegia affecting left dominant side (HCC)   History of stroke   Sickle cell trait   Dysautonomia (HCC)   Status cardiac pacemaker    Plan   ## Psychiatric Medication Recommendations:  None at this time- patient declined  ## Medical Decision Making Capacity: Not specifically addressed in this encounter     ## Disposition:-- There are no psychiatric contraindications to discharge at this time  ## Behavioral / Environmental: - No specific recommendations at this time.     ## Safety and Observation Level:  - Based on my clinical evaluation, I estimate the patient to be at low risk of self harm in the current setting. - At this time, we recommend  routine. This decision is based on my review of the chart including patient's history and current presentation, interview of the patient, mental status examination, and consideration of suicide risk including evaluating suicidal ideation, plan, intent, suicidal or self-harm behaviors, risk factors, and protective factors. This judgment is based  on our ability to directly address suicide risk, implement suicide prevention strategies, and develop a safety plan while the patient is in the clinical setting. Please contact our team if there is a concern that risk level has changed.  CSSR Risk Category:C-SSRS RISK  CATEGORY: No Risk  Suicide Risk Assessment: Patient has following modifiable risk factors for suicide: pain, medical illness (ie new dx of cancer), which we are addressing by providing patient with follow-up resources for CBT or support groups. Patient has following non-modifiable or demographic risk factors for suicide: N/A Patient has the following protective factors against suicide: Supportive family, Supportive friends, Pets in the home, no history of suicide attempts, and no history of NSSIB  Thank you for this consult request. Recommendations have been communicated to the primary team.  We will sign off at this time.   Zelda Sharps, NP        History of Present Illness  Relevant Aspects of [facility]   Patient Report:  Psychiatry was consulted to assess patient due to increased tearfulness and potential psychosomatic symptoms reported by the medical team.  On assessment today patient is currently denying suicidal homicidal ideations.  She denied auditory or visual hallucinations.  We discussed her increased tearfulness recently, and patient reported just feeling overwhelmed with the recent events that have occurred in her life.On current presentation there was no evidence of psychosis or mania and patient did not appear to be responding to internal stimuli. At this time, patient does not appear to be a risk to self or others.While future psychiatric events cannot be accurately predicted, the patient does not currently require acute inpatient psychiatric care and does not currently meet [place]  involuntary commitment criteria.  Patient was agreeable to being aligned with potential CBT therapy options or support groups to follow-up with post discharge from the hospital.  We discussed medication options such as sertraline that could potentially help with the tearfulness and emotional symptoms from previous stroke and current presentation, patient politely declined at this time.   Patient appears alert and oriented x 3 throughout this provider's assessment.  During exam, patient displayed logical, coherent and rational thought process.  Patient is not currently meeting criteria for forced nonemergent medications at this time.  Social work was informed of patient agreement to follow-up with CBT therapy options or support groups and will work on resources for patient.  At this time, psychiatry will sign off, please notify psychiatry team of any changes in patient condition or any further needs.  Patient was able to accurately explain to this provider the events that led up to her admission to the hospital.  She reported recently she has had a lot going on, but reports doing better today.  She reported she currently lives alone, but her son comes in and out of the house to check on her.  She reports desire to have caretaker or home health care services that would be able to check on her and help her 1-2 times a week.  She reported previously asking for these services, but was denied due to eligibility and insurance reasons.  She reported walking around the house with a 4-prong cane and using her rollator walker outside.  She denied current suicidal or homicidal ideations.  She also denied any auditory or visual hallucinations.  Patient denied any previous mental health diagnoses.  She denied any previous inpatient psychiatric admissions as well.  She denied any self-harm or suicide attempts in the past.  She denied any current  feelings of sadness, hopelessness, or worthlessness.  Patient also denied any current anxiety.  Patient reported she currently has 2 dogs and 1 cat that she lives with.  She reported her family is currently helping take care of her animals while she is in the hospital.  She reported she currently is disabled and receives disability check.  She denied any access to weapons.  She denied any known legal charges.  She denied any use of alcohol, illicit substances or  tobacco products.  Patient was calm and cooperative throughout psychiatry's assessment today.  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 Living Situation: At home alone, with son checking in occasionally  Spiritual Hx: Christianity  Access to weapons/lethal means: Denied   Substance History Alcohol: Denied   Tobacco: Denied  Illicit drugs: Denied  Prescription drug abuse: Denied  Rehab hx: Denied   Exam Findings  Physical Exam: Deferred to hospital MD, note reviewed Vital Signs:  Temp:  [97.7 F (36.5 C)-98.7 F (37.1 C)] 97.7 F (36.5 C) (10/27 1326) Pulse Rate:  [50-60] 53 (10/27 1326) Resp:  [8-18] 17 (10/27 1326) BP: (91-113)/(33-66) 93/33 (10/27 1326) SpO2:  [98 %-99 %] 98 % (10/27 1326) Blood pressure (!) 93/33, pulse (!) 53, temperature 97.7 F (36.5 C), temperature source Oral, resp. rate 17, height 5' 4 (1.626 m), weight 100 kg, SpO2 98%. Body mass index is 37.84 kg/m.    Mental Status Exam: General Appearance: Casual  Orientation:  Full (Time, Place, and Person)  Memory:  Immediate;   Fair Recent;   Fair Remote;   Fair  Concentration:  Concentration: Fair and Attention Span: Fair  Recall:  Fair  Attention  Fair  Eye Contact:  Good  Speech:  Clear and Coherent  Language:  Good  Volume:  Normal  Mood: Better  Affect:  Appropriate and Congruent  Thought Process:  Coherent, Goal Directed, and Linear  Thought Content:  WDL and Logical   Suicidal Thoughts:  No  Homicidal Thoughts:  No  Judgement:  Fair  Insight:  Fair  Psychomotor Activity:  Normal  Akathisia:  No  Fund of Knowledge:  Fair      Assets:  Manufacturing Systems Engineer Desire for Improvement Housing Social Support  Cognition:  WNL  ADL's:  Impaired  AIMS (if indicated):        Other History   These have been pulled in through the EMR, reviewed, and updated if appropriate.  Family History:  The patient's family history includes Breast cancer in her maternal aunt and paternal aunt.  Medical History: Past Medical History:  Diagnosis Date   Arthritis    C2 cervical fracture (HCC)    Carpal tunnel syndrome of left wrist    Cervical myelopathy (HCC)    Edema of spinal cord (HCC)    History of cardiac arrest    History of TIA (transient ischemic attack)    Hypomagnesemia    IDA (iron deficiency anemia)    Low vitamin D level    Neuropathic pain of  both legs    Neuropathy    Osteoarthritis    Pneumonia    Presence of permanent cardiac pacemaker    Type 2 diabetes mellitus [facility])     Surgical History: Past Surgical History:  Procedure Laterality Date   BIOPSY  02/13/2023   Procedure: BIOPSY;  Surgeon: Maryruth Ole DASEN, MD;  Location: [facility] ENDOSCOPY;  Service: Endoscopy;;   CERVIX SURGERY     COLONOSCOPY WITH PROPOFOL  N/A 02/13/2023   Procedure: COLONOSCOPY WITH PROPOFOL ;  Surgeon: Maryruth Ole DASEN, MD;  Location: [facility] ENDOSCOPY;  Service: Endoscopy;  Laterality: N/A;   CORPECTOMY CERVICLE     W/DECOMPRESSION BY ANTERIOR APPROACH   ENDOMETRIAL ABLATION     [facility]       Medications:   Current Facility-Administered Medications:    acetaminophen  (TYLENOL ) tablet 650 mg, 650 mg, Oral, Q4H PRN, 650 mg at 12/10/23 0910 **OR** acetaminophen  (TYLENOL ) 160 MG/5ML solution 650 mg, 650 mg, Per Tube, Q4H PRN **OR** acetaminophen  (TYLENOL ) suppository 650 mg, 650 mg, Rectal, Q4H PRN, Cox, Amy N, DO   cholecalciferol (VITAMIN D3) 25 MCG (1000  UNIT) tablet 1,000 Units, 1,000 Units, Oral, Daily, Cox, Amy N, DO, 1,000 Units at 12/11/23 1006   cyanocobalamin (VITAMIN B12) tablet 1,000 mcg, 1,000 mcg, Oral, Daily, Cox, Amy N, DO, 1,000 mcg at 12/11/23 1006   ferrous sulfate tablet 325 mg, 325 mg, Oral, q morning, Cox, Amy N, DO, 325 mg at 12/11/23 1006   gabapentin  (NEURONTIN ) capsule 300 mg, 300 mg, Oral, Daily, 300 mg at 12/11/23 1006 **AND** gabapentin  (NEURONTIN ) capsule 300 mg, 300 mg, Oral, Q1400, 300 mg at 12/10/23 1403 **AND** gabapentin  (NEURONTIN ) capsule 900 mg, 900 mg, Oral, QHS, ParkLeonor BROCKS, RPH, 900 mg at 12/10/23 2126   senna-docusate (Senokot-S) tablet 1 tablet, 1 tablet, Oral, QHS PRN, Cox, Amy N, DO  Allergies: Allergies  Allergen Reactions   Latex Itching    Tyrese Ficek, NP

## 2023-12-11 NOTE — Progress Notes (Signed)
 PHARMACIST - PHYSICIAN COMMUNICATION  CONCERNING:  Enoxaparin (Lovenox) for DVT Prophylaxis    RECOMMENDATION: Patient was prescribed enoxaprin 40mg  q24 hours for VTE prophylaxis.   Filed Weights   12/10/23 0401  Weight: 100 kg (220 lb 7.4 oz)    Body mass index is 37.84 kg/m.  Estimated Creatinine Clearance: 73.1 mL/min (by C-G formula based on SCr of 0.94 mg/dL).   Based on [facility] policy patient is candidate for enoxaparin 0.5mg /kg TBW SQ every 24 hours based on BMI being >30.   DESCRIPTION: Pharmacy has adjusted enoxaparin dose per [facility] policy.  Patient is now receiving enoxaparin 50 mg every 24 hours    Lum VEAR Mania, PharmD Clinical Pharmacist  12/11/2023 8:20 PM

## 2023-12-11 NOTE — Progress Notes (Signed)
  PROGRESS NOTE    Catherine Avery  FMW:[id number] DOB: 12/19/1965 DOA: 12/10/2023 PCP: [name], MD  101A/101A-AA  LOS: 0 days   Brief hospital course:   Assessment & Plan: Catherine Avery is a 58 year old female with history of bradycardia, neuropathy, history of CVA, TIA with left-sided hemiplegia, pacemaker status, who presents to ED for chief concerns of weakness, lethargy, slurred speech.   Patient fell at home.   * Stroke-like symptom Neurology has been consulted. MRI of the brain not able to be performed due to patient's pacemaker status. --LDL 109 --CTA head and neck --Echo --ASA 81mg  daily + plavix 75mg  daily x21 days f/b plavix 75mg  daily monotherapy after that  --start lipitor 40  Dysautonomia (HCC) Status post pacemaker status  Hemiplegia affecting left dominant side (HCC) Currently at baseline  Neuropathy --cont gabapentin   Hypotension --pt reported BP low at baseline --500 ml bolus today  --start midodrine   DVT prophylaxis: Lovenox SQ Code Status: Full code  Family Communication:  Level of care: Telemetry Medical Dispo:   The patient is from: home Anticipated d/c is to: home Anticipated d/c date is: tomorrow   Subjective and Interval History:  Pt reported weakness improved.  Complained of urinary incontinence.    Bladder scan today showed 260 pre void and 100 post void.    Objective: Vitals:   12/11/23 1326 12/11/23 1342 12/11/23 1500 12/11/23 1730  BP: (!) 93/33 (!) 91/36 121/65 118/68  Pulse: (!) 53   (!) 52  Resp: 17   18  Temp: 97.7 F (36.5 C)   98.7 F (37.1 C)  TempSrc: Oral   Oral  SpO2: 98%   99%  Weight:      Height:        Intake/Output Summary (Last 24 hours) at 12/11/2023 2009 Last data filed at 12/11/2023 1624 Gross per 24 hour  Intake 688.58 ml  Output 160 ml  Net 528.58 ml   Filed Weights   12/10/23 0401  Weight: 100 kg    Examination:   Constitutional: NAD, AAOx3 HEENT: conjunctivae and lids  normal, EOMI CV: No cyanosis.   RESP: normal respiratory effort, on RA Neuro: II - XII grossly intact.   Psych: Normal mood and affect.  Appropriate judgement and reason   Data Reviewed: I have personally reviewed labs and imaging studies  Time spent: 50 minutes  Ellouise Haber, MD [facility] If 7PM-7AM, please contact night-coverage 12/11/2023, 8:09 PM

## 2023-12-11 NOTE — Progress Notes (Signed)
 Brief assessment:  Patient is from home and presented to the ED with L hemiplegia and slurred speech. Admitted to r/o stroke. Therapy recs are pending at this time. Please outreach to [facility] if needs are identified.

## 2023-12-11 NOTE — Evaluation (Signed)
 Physical Therapy Evaluation Patient Details Name: Catherine Avery MRN: [id number] DOB: 10-04-1965 Today's Date: 12/11/2023  History of Present Illness  Ms. Catherine Avery is a 58 year old female with history of bradycardia, neuropathy, history of CVA, TIA with left-sided hemiplegia, pacemaker status, who presents to ED for chief concerns of weakness, lethargy, slurred speech.  Clinical Impression  Patient seen for initial PT evaluation due to decline in functional status. Patient is A&O x 4. Baseline mobility reported as modI using cane in home and rollator for community ambulation, currently requiring CGA/minA for room ambulation. Gait assessed with RW and +1 hand held assisst able to walk 35 feet in room, limited by mild unsteadiness in lower extremity. Pt lives alone with a few steps to enter with rail on L side at one entry and R side at the other. Clinical impression: patient presents with mild to moderate mobility limitations secondary to recent fall and stroke-like symptoms. Recommend skilled PT to address safety, mobility, and discharge planning. PT recommend HHPT at this time.        If plan is discharge home, recommend the following: A little help with bathing/dressing/bathroom;A little help with walking and/or transfers   Can travel by private vehicle        Equipment Recommendations None recommended by PT  Recommendations for Other Services       Functional Status Assessment Patient has had a recent decline in their functional status and demonstrates the ability to make significant improvements in function in a reasonable and predictable amount of time.     Precautions / Restrictions Precautions Precautions: Fall Precaution/Restrictions Comments: reports random spasm in body Restrictions Weight Bearing Restrictions Per Provider Order: No      Mobility  Bed Mobility Overal bed mobility: Needs Assistance Bed Mobility: Rolling, Sidelying to Sit Rolling: Min  assist Sidelying to sit: Min assist       General bed mobility comments: slow to move    Transfers Overall transfer level: Needs assistance Equipment used: Rolling walker (2 wheels) Transfers: Sit to/from Stand, Bed to chair/wheelchair/BSC Sit to Stand: Contact guard assist, Min assist           General transfer comment: requires use of arms of chair to stand    Ambulation/Gait Ambulation/Gait assistance: Min assist, Contact guard assist Gait Distance (Feet): 35 Feet Assistive device: Rolling walker (2 wheels), 1 person hand held assist Gait Pattern/deviations: Step-to pattern, Decreased stride length, Trunk flexed, Wide base of support       General Gait Details: use ambulating with +1 hand hand assist and with RW with slow movement speed  Stairs            Wheelchair Mobility     Tilt Bed    Modified Rankin (Stroke Patients Only)       Balance Overall balance assessment: Needs assistance Sitting-balance support: Feet supported Sitting balance-Leahy Scale: Good     Standing balance support: Reliant on assistive device for balance, During functional activity Standing balance-Leahy Scale: Fair                               Pertinent Vitals/Pain Pain Assessment Pain Assessment: 0-10 Pain Score: 5  Pain Location: lower back Pain Descriptors / Indicators: Aching, Spasm Pain Intervention(s): Limited activity within patient's tolerance, Monitored during session, Repositioned    Home Living Family/patient expects to be discharged to:: Private residence Living Arrangements: Alone Available Help at Discharge: Family (son comes over at  least twice per month) Type of Home: House Home Access: Stairs to enter Entrance Stairs-Rails: Left Entrance Stairs-Number of Steps: 2   Home Layout: One level Home Equipment: Cane - Programmer, Applications (2 wheels);Rollator (4 wheels)      Prior Function Prior Level of Function : Independent/Modified  Independent             Mobility Comments: use cane everyday and use rollator for community; reports random spasm which prevent movement       Extremity/Trunk Assessment        Lower Extremity Assessment Lower Extremity Assessment: Generalized weakness    Cervical / Trunk Assessment Cervical / Trunk Assessment: Kyphotic  Communication   Communication Communication: No apparent difficulties    Cognition Arousal: Alert Behavior During Therapy: WFL for tasks assessed/performed   PT - Cognitive impairments: No apparent impairments                         Following commands: Intact       Cueing Cueing Techniques: Verbal cues     General Comments      Exercises     Assessment/Plan    PT Assessment Patient needs continued PT services  PT Problem List Decreased strength;Decreased activity tolerance;Decreased balance;Decreased mobility       PT Treatment Interventions DME instruction;Gait training;Functional mobility training;Therapeutic activities;Therapeutic exercise;Neuromuscular re-education;Balance training;Patient/family education    PT Goals (Current goals can be found in the Care Plan section)  Acute Rehab PT Goals Patient Stated Goal: Pt wants to go home with HHPT PT Goal Formulation: With patient Time For Goal Achievement: 12/30/23 Potential to Achieve Goals: Good    Frequency Min 2X/week     Co-evaluation               AM-PAC PT 6 Clicks Mobility  Outcome Measure Help needed turning from your back to your side while in a flat bed without using bedrails?: A Little Help needed moving from lying on your back to sitting on the side of a flat bed without using bedrails?: A Little Help needed moving to and from a bed to a chair (including a wheelchair)?: A Little Help needed standing up from a chair using your arms (e.g., wheelchair or bedside chair)?: A Little Help needed to walk in hospital room?: A Little Help needed climbing  3-5 steps with a railing? : A Lot 6 Click Score: 17    End of Session Equipment Utilized During Treatment: Gait belt Activity Tolerance: Patient tolerated treatment well Patient left: in chair;with chair alarm set Nurse Communication: Mobility status PT Visit Diagnosis: Unsteadiness on feet (R26.81);Muscle weakness (generalized) (M62.81);Repeated falls (R29.6);Other abnormalities of gait and mobility (R26.89)    Time: 9149-9078 PT Time Calculation (min) (ACUTE ONLY): 31 min   Charges:   PT Evaluation $PT Eval Low Complexity: 1 Low   PT General Charges $$ ACUTE PT VISIT: 1 Visit         Sherlean Lesches DPT, PT    Sherlean A Kristen Fromm 12/11/2023, 9:24 AM

## 2023-12-11 NOTE — Consult Note (Signed)
 NEUROLOGY CONSULT NOTE   Date of service: December 11, 2023 Patient Name: Catherine Avery MRN:  [id number] DOB:  [birth year] Chief Complaint: slurred speech Requesting Provider: Awanda City, MD  History of Present Illness  Catherine Avery is a 58 y.o. 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 CT head showed no acute process. Patient is unable to undergo MRI at [facility] 2/2 pacemaker. Patient had rapid response this AM for worsening generalized weakness and R sided headache with inability to move her legs on command.  LKW: day prior to admission Modified rankin score: 3-Moderate disability-requires help but walks WITHOUT assistance  NIHSS components Score: Comment  1a Level of Conscious 0[x]  1[]  2[]  3[]      1b LOC Questions 0[x]  1[]  2[]       1c LOC Commands 0[x]  1[]  2[]       2 Best Gaze 0[x]  1[]  2[]       3 Visual 0[x]  1[]  2[]  3[]      4 Facial Palsy 0[x]  1[]  2[]  3[]      5a Motor Arm - left 0[]  1[]  2[x]  3[]  4[]  UN[]    5b Motor Arm - Right 0[]  1[x]  2[]  3[]  4[]  UN[]    6a Motor Leg - Left 0[]  1[]  2[]  3[x]  4[]  UN[]    6b Motor Leg - Right 0[]  1[]  2[]  3[x]  4[]  UN[]    7 Limb Ataxia 0[x]  1[]  2[]  UN[]      8 Sensory 0[x]  1[]  2[]  UN[]      9 Best Language 0[x]  1[]  2[]  3[]      10 Dysarthria 0[]  1[x]  2[]  UN[]      11 Extinct. and Inattention 0[x]  1[]  2[]       TOTAL:  10      ROS   Comprehensive ROS performed and pertinent positives documented in HPI   Past History   Past Medical History:  Diagnosis Date   Arthritis    C2 cervical fracture (HCC)    Carpal tunnel syndrome of left wrist    Cervical myelopathy (HCC)    Edema of spinal cord (HCC)    History of cardiac arrest    History of TIA (transient ischemic attack)    Hypomagnesemia    IDA (iron deficiency anemia)    Low vitamin D level    Neuropathic pain of both legs    Neuropathy    Osteoarthritis     Pneumonia    Presence of permanent cardiac pacemaker    Type 2 diabetes mellitus (HCC)     Past Surgical History:  Procedure Laterality Date   BIOPSY  02/13/2023   Procedure: BIOPSY;  Surgeon: Maryruth Ole DASEN, MD;  Location: [facility] ENDOSCOPY;  Service: Endoscopy;;   CERVIX SURGERY     COLONOSCOPY WITH PROPOFOL  N/A 02/13/2023   Procedure: COLONOSCOPY WITH PROPOFOL ;  Surgeon: Maryruth Ole DASEN, MD;  Location: [facility] ENDOSCOPY;  Service: Endoscopy;  Laterality: N/A;   CORPECTOMY CERVICLE     W/DECOMPRESSION BY ANTERIOR APPROACH   ENDOMETRIAL ABLATION     [facility]      Family History: Family History  Problem Relation Age of Onset   Breast cancer Maternal Aunt    Breast cancer Paternal Aunt     Social History  reports that she has never smoked. She has never used smokeless tobacco. She reports that she does not currently use alcohol. She reports that she does not currently use drugs.  Allergies  Allergen Reactions   Latex Itching  Medications   Current Facility-Administered Medications:    acetaminophen  (TYLENOL ) tablet 650 mg, 650 mg, Oral, Q4H PRN, 650 mg at 12/29/23 0910 **OR** acetaminophen  (TYLENOL ) 160 MG/5ML solution 650 mg, 650 mg, Per Tube, Q4H PRN **OR** acetaminophen  (TYLENOL ) suppository 650 mg, 650 mg, Rectal, Q4H PRN, Cox, Amy N, DO   cholecalciferol (VITAMIN D3) 25 MCG (1000 UNIT) tablet 1,000 Units, 1,000 Units, Oral, Daily, Cox, Amy N, DO, 1,000 Units at 12/29/23 1014   cyanocobalamin (VITAMIN B12) tablet 1,000 mcg, 1,000 mcg, Oral, Daily, Cox, Amy N, DO, 1,000 mcg at 2023/12/29 1014   ferrous sulfate tablet 325 mg, 325 mg, Oral, q morning, Cox, Amy N, DO, 325 mg at December 29, 2023 1014   gabapentin  (NEURONTIN ) capsule 300 mg, 300 mg, Oral, Daily, 300 mg at 2023-12-29 1014 **AND** gabapentin  (NEURONTIN ) capsule 300 mg, 300 mg, Oral, Q1400, 300 mg at 2023/12/29 1403 **AND** gabapentin  (NEURONTIN ) capsule 900 mg, 900 mg, Oral, QHS, Park, Leonor BROCKS, [place], 900 mg at 12-29-23  2126   senna-docusate (Senokot-S) tablet 1 tablet, 1 tablet, Oral, QHS PRN, Cox, Amy N, DO  Vitals   Vitals:   12-29-23 1248 Dec 29, 2023 1941 12/11/23 0000 12/11/23 0339  BP: (!) 112/57 (!) 104/35 113/66 (!) 110/49  Pulse: (!) 50 (!) 52 (!) 52 (!) 50  Resp: 16 16 (!) 8 18  Temp:  98.7 F (37.1 C) 98.1 F (36.7 C) 98.6 F (37 C)  TempSrc:  Oral Oral Oral  SpO2: 99% 99% 99% 98%  Weight:      Height:        Body mass index is 37.84 kg/m.   Physical Exam   Gen: patient lying in bed, NAD CV: extremities appear well-perfused Resp: normal WOB  Neurologic exam MS: alert, oriented x4, follows commands Speech: mild dysarthria, no aphasia CN: PERRL, VFF, EOMI, sensation intact, face symmetric, hearing intact to voice Motor: LUE drift to bed, RUE no drift, BLE can wiggle toes but no movement anti-gravity Sensory: SILT Coordination: FNF intact bilat Gait: deferred   Labs/Imaging/Neurodiagnostic studies   CBC:  Recent Labs  Lab December 29, 2023 0410  WBC 5.6  NEUTROABS 3.7  HGB 11.3*  HCT 34.7*  MCV 88.1  PLT 184   Basic Metabolic Panel:  Lab Results  Component Value Date   NA 143 12/29/2023   K 3.8 12/29/23   CO2 24 2023/12/29   GLUCOSE 103 (H) December 29, 2023   BUN 23 (H) 29-Dec-2023   CREATININE 0.94 29-Dec-2023   CALCIUM 8.9 29-Dec-2023   GFRNONAA >60 2023-12-29   GFRAA >60 11/16/2018   Lipid Panel:  Lab Results  Component Value Date   LDLCALC 78 09/17/2009   HgbA1c: No results found for: HGBA1C Urine Drug Screen:     Component Value Date/Time   LABOPIA NONE DETECTED 12/29/23 0410   COCAINSCRNUR NONE DETECTED Dec 29, 2023 0410   LABBENZ NONE DETECTED Dec 29, 2023 0410   AMPHETMU NONE DETECTED 12-29-2023 0410   THCU NONE DETECTED 12-29-23 0410   LABBARB NONE DETECTED Dec 29, 2023 0410    Alcohol Level     Component Value Date/Time   ETH <10 11/16/2018 1442   INR  Lab Results  Component Value Date   INR 0.9 Dec 29, 2023   APTT  Lab Results  Component  Value Date   APTT 30 07/16/2021   AED levels: No results found for: PHENYTOIN, ZONISAMIDE, LAMOTRIGINE, LEVETIRACETA  CT Head without contrast(Personally reviewed): No acute process  ASSESSMENT   Catherine Avery is a 58 y.o. 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 Recommend stroke workup as follows. Patient's BLE weakness had acute onset during an anxiety attack and has functional features on exam. Will continue to monitor.  RECOMMENDATIONS   - Permissive HTN x48 hrs from sx onset or until stroke ruled out by MRI goal BP <220/110. PRN labetalol or hydralazine if BP above these parameters. Avoid oral antihypertensives. - Patient is unable to undergo MRI 2/2 pacemaker, recommend repeat CT at 24 hrs - CTA H&N - TTE - Check A1c and LDL + add statin per guidelines - ASA 81mg  daily + plavix 75mg  daily x21 days f/b plavix 75mg  daily monotherapy after that - q4 hr neuro checks - STAT head CT for any change in neuro exam - Tele - PT/OT/SLP - Stroke education - Amb referral to neurology upon discharge   Will continue to follow  ______________________________________________________________________    Signed, Elida CHRISTELLA Ross, MD [facility]

## 2023-12-11 NOTE — Evaluation (Signed)
 Occupational Therapy Evaluation Patient Details Name: Catherine Avery MRN: [id number] DOB: 1965/03/01 Today's Date: 12/11/2023   History of Present Illness   Catherine Avery is a 58 year old female with history of bradycardia, neuropathy, history of CVA, TIA with left-sided hemiplegia, pacemaker status, who presents to ED for chief concerns of weakness, lethargy, slurred speech. Head CT negative, MRI incompatible with pacemaker.   Clinical Impressions Catherine Avery was seen for OT evaluation this date. Prior to hospital admission, pt was MOD I using QC in house, rollator outside of house. Pt lives alone with 2 dogs, son checks in 2x/month. Pt currently requires  SBA + QC for ADL t/f. SUPERVISION standing bathing and dressing tasks, tolerates ~15 min standing. Pt would benefit from skilled OT to address noted impairments and functional limitations (see below for any additional details). Upon hospital discharge, recommend OT follow up.    If plan is discharge home, recommend the following:   Help with stairs or ramp for entrance     Functional Status Assessment   Patient has had a recent decline in their functional status and demonstrates the ability to make significant improvements in function in a reasonable and predictable amount of time.     Equipment Recommendations   None recommended by OT     Recommendations for Other Services         Precautions/Restrictions   Precautions Precautions: Fall Recall of Precautions/Restrictions: Intact Restrictions Weight Bearing Restrictions Per Provider Order: No     Mobility Bed Mobility               General bed mobility comments: not tested    Transfers Overall transfer level: Needs assistance Equipment used: Quad cane Transfers: Sit to/from Stand Sit to Stand: Contact guard assist                  Balance Overall balance assessment: Needs assistance Sitting-balance support: Feet supported Sitting balance-Leahy  Scale: Normal     Standing balance support: No upper extremity supported, During functional activity Standing balance-Leahy Scale: Good                             ADL either performed or assessed with clinical judgement   ADL Overall ADL's : Needs assistance/impaired                                       General ADL Comments: SBA + QC for ADL t/f. SUPERVISION standing bathing and dressing tasks     Vision         Perception         Praxis         Pertinent Vitals/Pain Pain Assessment Pain Assessment: No/denies pain     Extremity/Trunk Assessment Upper Extremity Assessment Upper Extremity Assessment: Overall WFL for tasks assessed   Lower Extremity Assessment Lower Extremity Assessment: Generalized weakness   Cervical / Trunk Assessment Cervical / Trunk Assessment: Kyphotic   Communication Communication Communication: No apparent difficulties   Cognition Arousal: Alert Behavior During Therapy: WFL for tasks assessed/performed Cognition: No apparent impairments                               Following commands: Intact       Cueing  General Comments   Cueing Techniques: Verbal cues  Exercises     Shoulder Instructions      Home Living Family/patient expects to be discharged to:: Private residence Living Arrangements: Alone Available Help at Discharge: Family;Available PRN/intermittently Type of Home: House Home Access: Stairs to enter [employer] of Steps: 2 Entrance Stairs-Rails: Left Home Layout: One level     Bathroom Shower/Tub: Producer, Television/film/video: Standard Bathroom Accessibility: Yes   Home Equipment: Cane - Programmer, Applications (2 wheels);Rollator (4 wheels);Shower seat;Hand held shower head;Hospital bed   Additional Comments: reports washer/dryer are outside      Prior Functioning/Environment Prior Level of Function : Independent/Modified Independent              Mobility Comments: use cane everyday and use rollator for community ADLs Comments: PRN assist for carrying items in from grocery store. Son visits 2x/month    OT Problem List: Decreased activity tolerance;Impaired balance (sitting and/or standing)   OT Treatment/Interventions: Self-care/ADL training;Therapeutic exercise;Energy conservation;DME and/or AE instruction;Therapeutic activities;Patient/family education      OT Goals(Current goals can be found in the care plan section)   Acute Rehab OT Goals Patient Stated Goal: to go home OT Goal Formulation: With patient Time For Goal Achievement: 12/25/23 Potential to Achieve Goals: Good ADL Goals Pt Will Perform Grooming: with modified independence;standing Pt Will Perform Lower Body Dressing: with modified independence;sit to/from stand Pt Will Transfer to Toilet: with modified independence;ambulating;regular height toilet   OT Frequency:  Min 2X/week    Co-evaluation              AM-PAC OT 6 Clicks Daily Activity     Outcome Measure Help from another person eating meals?: None Help from another person taking care of personal grooming?: A Little Help from another person toileting, which includes using toliet, bedpan, or urinal?: A Little Help from another person bathing (including washing, rinsing, drying)?: A Little Help from another person to put on and taking off regular upper body clothing?: None Help from another person to put on and taking off regular lower body clothing?: A Little 6 Click Score: 20   End of Session    Activity Tolerance: Patient tolerated treatment well Patient left: in chair;with call bell/phone within reach;with chair alarm set  OT Visit Diagnosis: Other abnormalities of gait and mobility (R26.89);Muscle weakness (generalized) (M62.81)                Time: 1009-1040 OT Time Calculation (min): 31 min Charges:  OT General Charges $OT Visit: 1 Visit OT Evaluation $OT Eval Low  Complexity: 1 Low OT Treatments $Self Care/Home Management : 23-37 mins  Catherine Avery, M.S. OTR/L  12/11/23, 12:32 PM  ascom [phone]

## 2023-12-11 NOTE — Plan of Care (Signed)

## 2023-12-11 NOTE — Care Management Obs Status (Signed)
 MEDICARE OBSERVATION STATUS NOTIFICATION   Patient Details  Name: Catherine Avery MRN: [id number] Date of Birth: [birth year]   Medicare Observation Status Notification Given:  Chaney BRANDY CHRISTIANE LELON, CMA 12/11/2023, 1:56 PM

## 2023-12-12 DIAGNOSIS — R2990 Unspecified symptoms and signs involving the nervous system: Secondary | ICD-10-CM | POA: Diagnosis not present

## 2023-12-12 LAB — ECHOCARDIOGRAM COMPLETE
AR max vel: 2.16 cm2
AV Area VTI: 1.94 cm2
AV Area mean vel: 1.88 cm2
AV Mean grad: 4 mmHg
AV Peak grad: 7 mmHg
Ao pk vel: 1.32 m/s
Area-P 1/2: 4.46 cm2
Calc EF: 69.2 %
Height: 64 in
MV VTI: 1.71 cm2
S' Lateral: 2.7 cm
Single Plane A2C EF: 67.8 %
Single Plane A4C EF: 69.4 %
Weight: 3527.36 [oz_av]

## 2023-12-12 MED ORDER — ASPIRIN 81 MG PO TBEC
81.0000 mg | DELAYED_RELEASE_TABLET | Freq: Every day | ORAL | 2 refills | Status: AC
Start: 2023-12-13 — End: ?

## 2023-12-12 MED ORDER — CLOPIDOGREL BISULFATE 75 MG PO TABS: 75.0000 mg | ORAL_TABLET | Freq: Every day | ORAL | 0 refills | Status: AC

## 2023-12-12 MED ORDER — ATORVASTATIN CALCIUM 40 MG PO TABS
40.0000 mg | ORAL_TABLET | Freq: Every day | ORAL | 2 refills | Status: AC
Start: 2023-12-13 — End: ?

## 2023-12-12 NOTE — Plan of Care (Signed)
  Problem: Ischemic Stroke/TIA Tissue Perfusion: Goal: Complications of ischemic stroke/TIA will be minimized Outcome: Progressing   Problem: Coping: Goal: Will verbalize positive feelings about self Outcome: Progressing   Problem: Self-Care: Goal: Ability to participate in self-care as condition permits will improve Outcome: Progressing   Problem: Elimination: Goal: Will not experience complications related to bowel motility Outcome: Progressing   Problem: Pain Managment: Goal: General experience of comfort will improve and/or be controlled Outcome: Progressing   Problem: Safety: Goal: Ability to remain free from injury will improve Outcome: Progressing

## 2023-12-12 NOTE — Plan of Care (Signed)
  Problem: Coping: Goal: Will verbalize positive feelings about self 12/12/2023 0345 by Melven Odilia PARAS, RN Outcome: Progressing 12/12/2023 0345 by Melven Odilia PARAS, RN Outcome: Progressing 12/12/2023 0303 by Melven Odilia PARAS, RN Outcome: Progressing   Problem: Ischemic Stroke/TIA Tissue Perfusion: Goal: Complications of ischemic stroke/TIA will be minimized 12/12/2023 0345 by Melven Odilia PARAS, RN Outcome: Progressing 12/12/2023 0345 by Melven Odilia PARAS, RN Outcome: Progressing 12/12/2023 0303 by Melven Odilia PARAS, RN Outcome: Progressing   Problem: Clinical Measurements: Goal: Ability to maintain clinical measurements within normal limits will improve 12/12/2023 0345 by Melven Odilia PARAS, RN Outcome: Progressing 12/12/2023 0345 by Melven Odilia PARAS, RN Outcome: Progressing 12/12/2023 0303 by Melven Odilia PARAS, RN Outcome: Progressing   Problem: Activity: Goal: Risk for activity intolerance will decrease 12/12/2023 0345 by Melven Odilia PARAS, RN Outcome: Progressing 12/12/2023 0345 by Melven Odilia PARAS, RN Outcome: Progressing 12/12/2023 0303 by Melven Odilia PARAS, RN Outcome: Progressing   Problem: Pain Managment: Goal: General experience of comfort will improve and/or be controlled 12/12/2023 0345 by Hawkins Seaman J, RN Outcome: Progressing

## 2023-12-12 NOTE — Discharge Summary (Signed)
 Physician Discharge Summary   [name]  female DOB: 1966/01/13  FMW:[id number]  PCP: [name], MD  Admit date: 12/10/2023 Discharge date: 12/12/2023  Admitted From: home Disposition:  home Home Health: Yes CODE STATUS: Full code  Discharge Instructions     Ambulatory referral to Urology   Complete by: As directed    Diet - low sodium heart healthy   Complete by: As directed    Discharge instructions   Complete by: As directed    Neurologist would like you to take aspirin 81 and plavix 75 mg daily together for 21 days, and then after that, just aspirin 81 mg daily alone.  You are started on Lipitor 40 mg daily to lower cholesterol.  Please see urology outpatient for your urinary incontinence. [facility] Course:  For full details, please see H&P, progress notes, consult notes and ancillary notes.  Briefly,  Catherine Avery is a 58 year old female with history of bradycardia, neuropathy, history of CVA, with left-sided hemiplegia, pacemaker status, who presented to ED for chief concerns of weakness, lethargy, slurred speech, and fall at home.   * Stroke-like symptom Neurology has been consulted. MRI of the brain not able to be performed due to patient's pacemaker status.  CTA head/neck performed 1 day later showed no acute stroke, and no large vessel occlusion.  Echo showed no evidence  of any interatrial shunt. --LDL 109 --discharged on ASA 81mg  daily + plavix 75mg  daily x21 days f/b ASA daily.  (Updated neuro rec, since pt wasn't taking anti-plt PTA, monotherapy will be ASA instead of plavix.) --started on lipitor 40   Dysautonomia (HCC) Status post pacemaker status   Hemiplegia affecting left dominant side (HCC) Currently at baseline   Neuropathy --cont gabapentin    Hypotension --pt reported BP low at baseline.  Asymptomatic.    Urinary incontinence --present for the last couple of month. --referral to outpatient urology.   Discharge  Diagnoses:  Principal Problem:   Stroke-like symptom Active Problems:   Neuropathy   Hemiplegia affecting left dominant side (HCC)   History of stroke   Sickle cell trait   Dysautonomia (HCC)   Status cardiac pacemaker     Discharge Instructions:  Allergies as of 12/12/2023       Reactions   Latex Itching        Medication List     TAKE these medications    acetaminophen  650 MG suppository Commonly known as: TYLENOL  Place 1,300 mg rectally every 8 (eight) hours as needed.   aspirin EC 81 MG tablet Take 1 tablet (81 mg total) by mouth daily. Swallow whole. Start taking on: December 13, 2023   atorvastatin 40 MG tablet Commonly known as: LIPITOR Take 1 tablet (40 mg total) by mouth daily. Start taking on: December 13, 2023   cholecalciferol 25 MCG (1000 UNIT) tablet Commonly known as: VITAMIN D3 Take 1,000 Units by mouth daily.   clopidogrel 75 MG tablet Commonly known as: PLAVIX Take 1 tablet (75 mg total) by mouth daily for 21 days. Start taking on: December 13, 2023   cyanocobalamin 1000 MCG tablet Commonly known as: VITAMIN B12 Take 1,000 mcg by mouth daily.   ferrous sulfate 324 (65 Fe) MG Tbec Take 1 tablet by mouth every morning.   gabapentin  300 MG capsule Commonly known as: NEURONTIN  Take 300 mg by mouth 5 (five) times daily. Take 1 capsule PO twice daily and 3 capsules at bedtime   senna 8.6 MG  tablet Commonly known as: SENOKOT Take 1 tablet by mouth daily.         Follow-up Information     [name], MD Follow up on 12/12/2023.   Specialty: Internal Medicine Why: hospital follow up attempted to call-no answer at office- patient needs to make appt Contact information: [address] [place] [address] [phone]         Francisca Redell BROCKS, MD Follow up.   Specialty: Urology Why: for urinary incontinence Contact information: [address] [place] [address] [phone]                  Allergies  Allergen Reactions   Latex Itching     The results of significant diagnostics from this hospitalization (including imaging, microbiology, ancillary and laboratory) are listed below for reference.   Consultations:   Procedures/Studies: CT ANGIO HEAD NECK W WO CM Result Date: 12/12/2023 EXAM: CTA Head and Neck with Intravenous Contrast. CT Head without Contrast. CLINICAL HISTORY: stroke-like symptoms. TECHNIQUE: Axial CTA images of the head and neck performed with intravenous contrast. MIP reconstructed images were created and reviewed. Axial computed tomography images of the head/brain performed without intravenous contrast. Note: Per PQRS, the description of internal carotid artery percent stenosis, including 0 percent or normal exam, is based on [employer] American Symptomatic Carotid Endarterectomy Trial (NASCET) criteria. Dose reduction technique was used including one or more of the following: automated exposure control, adjustment of mA and kV according to patient size, and/or iterative reconstruction. CONTRAST: With intravenous contrast (75 mL iohexol  (OMNIPAQUE ) 350 MG/ML injection). COMPARISON: CT from 12/10/2023. FINDINGS: CT HEAD: BRAIN: No acute intraparenchymal hemorrhage. No mass lesion. No CT evidence for acute territorial infarct. No midline shift or extra-axial collection. VENTRICLES: No hydrocephalus. ORBITS: The orbits are unremarkable. SINUSES AND MASTOIDS: The paranasal sinuses and mastoid air cells are clear. CTA NECK: COMMON CAROTID ARTERIES: No significant stenosis. No dissection or occlusion. INTERNAL CAROTID ARTERIES: No stenosis by NASCET criteria. No dissection or occlusion. VERTEBRAL ARTERIES: Left vertebral artery is hypoplastic and arises directly from the aortic arch. No significant stenosis. No dissection or occlusion. CTA HEAD: ANTERIOR CEREBRAL ARTERIES: No significant stenosis. No occlusion. No aneurysm. MIDDLE CEREBRAL ARTERIES: No  significant stenosis. No occlusion. No aneurysm. POSTERIOR CEREBRAL ARTERIES: Fetal type origin of the PCAs bilaterally. No significant stenosis. No occlusion. No aneurysm. BASILAR ARTERY: No significant stenosis. No occlusion. No aneurysm. OTHER: SOFT TISSUES: 2.1 cm nodule present at the lower pole of the left thyroid  lobe (series 10, image 30), indeterminate. No masses or lymphadenopathy. BONES: Postoperative changes from prior ACDF at C3-C6, with posterior fusion at C1-C6. No acute osseous abnormality. IMPRESSION: 1. Normal CTA of the head and neck. No large vessel occlusion or other emergent finding. No hemodynamically significant or correctable stenosis. 2. No other acute intracranial abnormality. 3. 2.1 cm left thyroid  nodule. Further evaluation with dedicated non-emergent thyroid  ultrasound recommended. . Electronically signed by: Morene Hoard MD 12/12/2023 01:09 AM EDT RP Workstation: [id number]   CT HEAD WO CONTRAST Result Date: 12/10/2023 EXAM: CT HEAD WITHOUT CONTRAST 12/10/2023 10:47:52 AM TECHNIQUE: CT of the head was performed without the administration of intravenous contrast. Automated exposure control, iterative reconstruction, and/or weight based adjustment of the mA/kV was utilized to reduce the radiation dose to as low as reasonably achievable. COMPARISON: CT head without contrast dated 12/10/2023 at 04:22 AM. CLINICAL HISTORY: Neuro deficit, acute, stroke suspected. FINDINGS: BRAIN AND VENTRICLES: No acute hemorrhage. No evidence of acute infarct. No  hydrocephalus. No extra-axial collection. No mass effect or midline shift. ORBITS: No acute abnormality. SINUSES: No acute abnormality. SOFT TISSUES AND SKULL: No acute soft tissue abnormality. No skull fracture. IMPRESSION: 1. No acute intracranial abnormality no significant interval change. Normal CT head for age. 2. These results were communicated to Dr. Matthews at 10:53 AM on 12/10/2023 by secure text page via the [facility] messaging  system. Electronically signed by: Lonni Necessary MD 12/10/2023 10:54 AM EDT RP Workstation: [id number]   DG Chest Port 1 View Result Date: 12/10/2023 EXAM: 1 VIEW(S) XRAY OF THE CHEST 12/10/2023 04:38:58 AM COMPARISON: CTA chest comparison 11/16/2018, and earlier. CLINICAL HISTORY: 58 year old female with questionable sepsis, lethargy, and left leg pain. FINDINGS: LUNGS AND PLEURA: Mildly improved lung volumes. Left lower lobe ventilation improved from prior CTA, and when allowing for portable technique both lungs now appear clear. No pulmonary edema. No pleural effusion. No pneumothorax. HEART AND MEDIASTINUM: No acute abnormality of the cardiac and mediastinal silhouettes. BONES AND SOFT TISSUES: Partially visible anterior and posterior cervical spine fusion hardware. No acute osseous abnormality. ABDOMEN: Paucity of bowel gas. IMPRESSION: 1. No acute cardiopulmonary abnormality. Electronically signed by: Helayne Hurst MD 12/10/2023 04:44 AM EDT RP Workstation: [id number]   CT Head Wo Contrast Result Date: 12/10/2023 EXAM: CT HEAD WITHOUT CONTRAST 12/10/2023 04:33:57 AM TECHNIQUE: CT of the head was performed without the administration of intravenous contrast. Automated exposure control, iterative reconstruction, and/or weight based adjustment of the mA/kV was utilized to reduce the radiation dose to as low as reasonably achievable. COMPARISON: Brain MRI 09/21/2018 and head CT 08/23/2022. CLINICAL HISTORY: 58 year old female with acute neuro deficit, stroke suspected. Presents lethargic, slow to respond, and reports left leg pain. FINDINGS: BRAIN AND VENTRICLES: No acute hemorrhage. No evidence of acute infarct. No hydrocephalus. No extra-axial collection. No mass effect or midline shift. Brain volume is stable and within normal limits. No suspicious intracranial vascular hyperdensity. ORBITS: No acute abnormality. SINUSES: Paranasal sinuses, middle ears and mastoids are clear. SOFT TISSUES AND SKULL:  No acute soft tissue abnormality. No skull fracture. Extensive chronic anterior and posterior cervical spine fusion hardware redemonstrated on the scout view. IMPRESSION: 1. Normal for age non-contrast CT appearance of the brain. Electronically signed by: Helayne Hurst MD 12/10/2023 04:41 AM EDT RP Workstation: [id number]      Labs: BNP (last 3 results) No results for input(s): BNP in the last 8760 hours. Basic Metabolic Panel: Recent Labs  Lab 12/10/23 0410  NA 143  K 3.8  CL 112*  CO2 24  GLUCOSE 103*  BUN 23*  CREATININE 0.94  CALCIUM 8.9   Liver Function Tests: Recent Labs  Lab 12/10/23 0410  AST 20  ALT 15  ALKPHOS 75  BILITOT 0.5  PROT 7.0  ALBUMIN 3.9   Recent Labs  Lab 12/10/23 0410  LIPASE 28   No results for input(s): AMMONIA in the last 168 hours. CBC: Recent Labs  Lab 12/10/23 0410  WBC 5.6  NEUTROABS 3.7  HGB 11.3*  HCT 34.7*  MCV 88.1  PLT 184   Cardiac Enzymes: No results for input(s): CKTOTAL, CKMB, CKMBINDEX, TROPONINI in the last 168 hours. BNP: Invalid input(s): POCBNP CBG: Recent Labs  Lab 12/10/23 0902  GLUCAP 90   D-Dimer No results for input(s): DDIMER in the last 72 hours. Hgb A1c Recent Labs    12/11/23 0441  HGBA1C 4.9   Lipid Profile Recent Labs    12/11/23 0441  CHOL 189  HDL 67  LDLCALC 109*  TRIG 67  CHOLHDL 2.8   Thyroid  function studies No results for input(s): TSH, T4TOTAL, T3FREE, THYROIDAB in the last 72 hours.  Invalid input(s): FREET3 Anemia work up No results for input(s): VITAMINB12, FOLATE, FERRITIN, TIBC, IRON, RETICCTPCT in the last 72 hours. Urinalysis    Component Value Date/Time   COLORURINE YELLOW (A) 12/10/2023 0410   APPEARANCEUR CLEAR (A) 12/10/2023 0410   LABSPEC 1.011 12/10/2023 0410   PHURINE 5.0 12/10/2023 0410   GLUCOSEU NEGATIVE 12/10/2023 0410   HGBUR NEGATIVE 12/10/2023 0410   BILIRUBINUR NEGATIVE 12/10/2023 0410   KETONESUR NEGATIVE  12/10/2023 0410   PROTEINUR NEGATIVE 12/10/2023 0410   NITRITE NEGATIVE 12/10/2023 0410   LEUKOCYTESUR NEGATIVE 12/10/2023 0410   Sepsis Labs Recent Labs  Lab 12/10/23 0410  WBC 5.6   Microbiology Recent Results (from the past 240 hours)  Resp panel by RT-PCR (RSV, Flu A&B, Covid) Anterior Nasal Swab     Status: None   Collection Time: 12/10/23  4:10 AM   Specimen: Anterior Nasal Swab  Result Value Ref Range Status   SARS Coronavirus 2 by RT PCR NEGATIVE NEGATIVE Final    Comment: (NOTE) SARS-CoV-2 target nucleic acids are NOT DETECTED.  The SARS-CoV-2 RNA is generally detectable in upper respiratory specimens during the acute phase of infection. The lowest concentration of SARS-CoV-2 viral copies this assay can detect is 138 copies/mL. A negative result does not preclude SARS-Cov-2 infection and should not be used as the sole basis for treatment or other patient management decisions. A negative result may occur with  improper specimen collection/handling, submission of specimen other than nasopharyngeal swab, presence of viral mutation(s) within the areas targeted by this assay, and inadequate number of viral copies(<138 copies/mL). A negative result must be combined with clinical observations, patient history, and epidemiological information. The expected result is Negative.  Fact Sheet for Patients:  bloggercourse.com  Fact Sheet for Healthcare Providers:  seriousbroker.it  This test is no t yet approved or cleared by the [place]  FDA and  has been authorized for detection and/or diagnosis of SARS-CoV-2 by FDA under an Emergency Use Authorization (EUA). This EUA will remain  in effect (meaning this test can be used) for the duration of the COVID-19 declaration under Section 564(b)(1) of the Act, 21 U.S.C.section 360bbb-3(b)(1), unless the authorization is terminated  or revoked sooner.       Influenza A by PCR  NEGATIVE NEGATIVE Final   Influenza B by PCR NEGATIVE NEGATIVE Final    Comment: (NOTE) The Xpert Xpress SARS-CoV-2/FLU/RSV plus assay is intended as an aid in the diagnosis of influenza from Nasopharyngeal swab specimens and should not be used as a sole basis for treatment. Nasal washings and aspirates are unacceptable for Xpert Xpress SARS-CoV-2/FLU/RSV testing.  Fact Sheet for Patients: bloggercourse.com  Fact Sheet for Healthcare Providers: seriousbroker.it  This test is not yet approved or cleared by the [place]  FDA and has been authorized for detection and/or diagnosis of SARS-CoV-2 by FDA under an Emergency Use Authorization (EUA). This EUA will remain in effect (meaning this test can be used) for the duration of the COVID-19 declaration under Section 564(b)(1) of the Act, 21 U.S.C. section 360bbb-3(b)(1), unless the authorization is terminated or revoked.     Resp Syncytial Virus by PCR NEGATIVE NEGATIVE Final    Comment: (NOTE) Fact Sheet for Patients: bloggercourse.com  Fact Sheet for Healthcare Providers: seriousbroker.it  This test is not yet approved or cleared by the [place]  FDA and has been authorized for detection and/or diagnosis  of SARS-CoV-2 by FDA under an Emergency Use Authorization (EUA). This EUA will remain in effect (meaning this test can be used) for the duration of the COVID-19 declaration under Section 564(b)(1) of the Act, 21 U.S.C. section 360bbb-3(b)(1), unless the authorization is terminated or revoked.  Performed at [facility], [address] [place], [place] [address]   Blood culture (routine single)     Status: None (Preliminary result)   Collection Time: 12/10/23  4:11 AM   Specimen: BLOOD LEFT WRIST  Result Value Ref Range Status   Specimen Description BLOOD LEFT WRIST  Final   Special Requests   Final     BOTTLES DRAWN AEROBIC AND ANAEROBIC Blood Culture results may not be optimal due to an inadequate volume of blood received in culture bottles   Culture   Final    NO GROWTH 2 DAYS Performed at [facility], [address], [place], [place] [address]    Report Status PENDING  Incomplete     Total time spend on discharging this patient, including the last patient exam, discussing the hospital stay, instructions for ongoing care as it relates to all pertinent caregivers, as well as preparing the medical discharge records, prescriptions, and/or referrals as applicable, is 35 minutes.    Ellouise Haber, MD  [facility] 12/12/2023, 10:38 AM

## 2023-12-12 NOTE — TOC Transition Note (Signed)
 Transition of Care [facility]) - Discharge Note   Patient Details  Name: Catherine Avery MRN: [id number] Date of Birth: 05/11/1965  [facility] CM/SW Contact:  Alvaro Louder, LCSW Phone Number: 12/12/2023, 12:17 PM   Clinical Narrative:   LCSWA confirmed with MD that patient is stable for discharge. LCSWA notified the patient and they are in agreement with discharge. LCSWA reached out to [facility] admissions coordinator an started service for patient. The patient reported that she would have her daughter come pick him up at discharge.  TOC signing off         Patient Goals and CMS Choice            Discharge Placement                       Discharge Plan and Services Additional resources added to the After Visit Summary for                                       Social Drivers of Health (SDOH) Interventions SDOH Screenings   Food Insecurity: No Food Insecurity (12/10/2023)  Housing: Low Risk  (12/10/2023)  Transportation Needs: No Transportation Needs (12/10/2023)  Utilities: Not At Risk (12/10/2023)  Financial Resource Strain: Low Risk  (10/30/2023)   Received from [facility]  Physical Activity: Insufficiently Active (01/13/2020)   Received from [facility]  Social Connections: Moderately Isolated (01/13/2020)   Received from [facility]  Stress: Stress Concern Present (01/13/2020)   Received from [facility]  Tobacco Use: Low Risk  (12/10/2023)     Readmission Risk Interventions     No data to display

## 2023-12-15 LAB — CULTURE, BLOOD (SINGLE): Culture: NO GROWTH
